# Patient Record
Sex: Female | Born: 1980 | Race: Black or African American | Hispanic: No | Marital: Single | Smoking: Never smoker
Health system: Southern US, Community
[De-identification: ages and names within clinical notes are randomized; demographics above are authoritative.]

## PROBLEM LIST (undated history)

## (undated) ENCOUNTER — Inpatient Hospital Stay (HOSPITAL_COMMUNITY): Payer: Self-pay

## (undated) DIAGNOSIS — R011 Cardiac murmur, unspecified: Secondary | ICD-10-CM

## (undated) DIAGNOSIS — B1910 Unspecified viral hepatitis B without hepatic coma: Secondary | ICD-10-CM

## (undated) DIAGNOSIS — F32A Depression, unspecified: Secondary | ICD-10-CM

## (undated) DIAGNOSIS — F329 Major depressive disorder, single episode, unspecified: Secondary | ICD-10-CM

## (undated) DIAGNOSIS — L409 Psoriasis, unspecified: Secondary | ICD-10-CM

## (undated) HISTORY — PX: WISDOM TOOTH EXTRACTION: SHX21

## (undated) HISTORY — PX: NO PAST SURGERIES: SHX2092

---
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
## 2001-04-21 ENCOUNTER — Ambulatory Visit: Admission: RE | Admit: 2001-04-21 | Discharge: 2001-04-21 | Payer: Self-pay | Admitting: Internal Medicine

## 2001-04-24 ENCOUNTER — Emergency Department (HOSPITAL_COMMUNITY): Admission: EM | Admit: 2001-04-24 | Discharge: 2001-04-24 | Payer: Self-pay | Admitting: Emergency Medicine

## 2001-07-13 ENCOUNTER — Emergency Department (HOSPITAL_COMMUNITY): Admission: EM | Admit: 2001-07-13 | Discharge: 2001-07-14 | Payer: Self-pay | Admitting: Emergency Medicine

## 2001-07-26 ENCOUNTER — Emergency Department (HOSPITAL_COMMUNITY): Admission: EM | Admit: 2001-07-26 | Discharge: 2001-07-26 | Payer: Self-pay | Admitting: Emergency Medicine

## 2001-07-31 ENCOUNTER — Emergency Department (HOSPITAL_COMMUNITY): Admission: EM | Admit: 2001-07-31 | Discharge: 2001-07-31 | Payer: Self-pay

## 2001-11-29 ENCOUNTER — Emergency Department (HOSPITAL_COMMUNITY): Admission: EM | Admit: 2001-11-29 | Discharge: 2001-11-29 | Payer: Self-pay | Admitting: *Deleted

## 2001-12-04 ENCOUNTER — Emergency Department (HOSPITAL_COMMUNITY): Admission: EM | Admit: 2001-12-04 | Discharge: 2001-12-04 | Payer: Self-pay | Admitting: Emergency Medicine

## 2001-12-04 ENCOUNTER — Encounter: Payer: Self-pay | Admitting: *Deleted

## 2002-09-08 ENCOUNTER — Other Ambulatory Visit: Admission: RE | Admit: 2002-09-08 | Discharge: 2002-09-08 | Payer: Self-pay | Admitting: Obstetrics and Gynecology

## 2007-10-20 ENCOUNTER — Other Ambulatory Visit (HOSPITAL_COMMUNITY): Admission: RE | Admit: 2007-10-20 | Discharge: 2007-12-04 | Payer: Self-pay | Admitting: Psychiatry

## 2007-10-22 ENCOUNTER — Emergency Department (HOSPITAL_COMMUNITY): Admission: EM | Admit: 2007-10-22 | Discharge: 2007-10-22 | Payer: Self-pay | Admitting: *Deleted

## 2007-10-23 ENCOUNTER — Ambulatory Visit: Payer: Self-pay | Admitting: Psychiatry

## 2009-06-13 ENCOUNTER — Emergency Department (HOSPITAL_COMMUNITY): Admission: EM | Admit: 2009-06-13 | Discharge: 2009-06-13 | Payer: Self-pay | Admitting: Emergency Medicine

## 2009-09-16 ENCOUNTER — Emergency Department (HOSPITAL_COMMUNITY): Admission: EM | Admit: 2009-09-16 | Discharge: 2009-09-17 | Payer: Self-pay | Admitting: Emergency Medicine

## 2009-09-16 LAB — CONVERTED CEMR LAB
BUN: 4 mg/dL
Basophils Absolute: 0.1 10*3/uL
Basophils Relative: 2 %
CO2: 23 meq/L
Calcium: 8.9 mg/dL
Chloride: 107 meq/L
Creatinine, Ser: 0.61 mg/dL
Eosinophils Absolute: 0.1 10*3/uL
Eosinophils Relative: 4 %
Glucose, Bld: 102 mg/dL
HCT: 43.2 %
Hemoglobin: 14.7 g/dL
Lymphocytes Relative: 35 %
Lymphs Abs: 1.1 10*3/uL
MCHC: 34 g/dL
MCV: 99.8 fL
Monocytes Absolute: 0.4 10*3/uL
Monocytes Relative: 12 %
Neutro Abs: 1.4 10*3/uL
Neutrophils Relative %: 47 %
Platelets: 213 10*3/uL
Potassium: 3.3 meq/L
RBC: 4.33 M/uL
RDW: 13.3 %
Sodium: 142 meq/L
WBC: 3.1 10*3/uL

## 2009-09-17 ENCOUNTER — Inpatient Hospital Stay (HOSPITAL_COMMUNITY): Admission: AD | Admit: 2009-09-17 | Discharge: 2009-09-19 | Payer: Self-pay | Admitting: Psychiatry

## 2009-09-17 ENCOUNTER — Ambulatory Visit: Payer: Self-pay | Admitting: Psychiatry

## 2009-10-11 ENCOUNTER — Encounter (INDEPENDENT_AMBULATORY_CARE_PROVIDER_SITE_OTHER): Payer: Self-pay | Admitting: Nurse Practitioner

## 2009-10-11 ENCOUNTER — Ambulatory Visit: Payer: Self-pay | Admitting: Nurse Practitioner

## 2009-10-11 ENCOUNTER — Telehealth (INDEPENDENT_AMBULATORY_CARE_PROVIDER_SITE_OTHER): Payer: Self-pay | Admitting: Nurse Practitioner

## 2009-10-11 DIAGNOSIS — F1010 Alcohol abuse, uncomplicated: Secondary | ICD-10-CM | POA: Insufficient documentation

## 2009-10-11 DIAGNOSIS — F329 Major depressive disorder, single episode, unspecified: Secondary | ICD-10-CM | POA: Insufficient documentation

## 2009-10-11 DIAGNOSIS — L408 Other psoriasis: Secondary | ICD-10-CM

## 2009-10-11 LAB — CONVERTED CEMR LAB: Rapid HIV Screen: NEGATIVE

## 2009-10-13 ENCOUNTER — Encounter (INDEPENDENT_AMBULATORY_CARE_PROVIDER_SITE_OTHER): Payer: Self-pay | Admitting: Nurse Practitioner

## 2009-10-13 LAB — CONVERTED CEMR LAB
Eosinophils Absolute: 0.3 10*3/uL (ref 0.0–0.7)
Eosinophils Relative: 10 % — ABNORMAL HIGH (ref 0–5)
HCT: 43.2 % (ref 36.0–46.0)
Lymphs Abs: 1 10*3/uL (ref 0.7–4.0)
MCV: 98.9 fL (ref 78.0–100.0)
Platelets: 175 10*3/uL (ref 150–400)
Sed Rate: 3 mm/hr (ref 0–22)
WBC: 2.7 10*3/uL — ABNORMAL LOW (ref 4.0–10.5)

## 2009-11-08 ENCOUNTER — Ambulatory Visit: Payer: Self-pay | Admitting: Nurse Practitioner

## 2009-11-09 ENCOUNTER — Encounter (INDEPENDENT_AMBULATORY_CARE_PROVIDER_SITE_OTHER): Payer: Self-pay | Admitting: Internal Medicine

## 2009-11-09 LAB — CONVERTED CEMR LAB
ALT: 43 units/L — ABNORMAL HIGH (ref 0–35)
AST: 66 units/L — ABNORMAL HIGH (ref 0–37)
BUN: 7 mg/dL (ref 6–23)
Basophils Absolute: 0.1 10*3/uL (ref 0.0–0.1)
Basophils Relative: 2 % — ABNORMAL HIGH (ref 0–1)
Cholesterol: 195 mg/dL (ref 0–200)
Creatinine, Ser: 0.66 mg/dL (ref 0.40–1.20)
Eosinophils Relative: 2 % (ref 0–5)
HCT: 43.2 % (ref 36.0–46.0)
HDL: 80 mg/dL (ref >39)
Hemoglobin: 14.8 g/dL (ref 12.0–15.0)
Hep B C IgM: NEGATIVE
MCHC: 34.3 g/dL (ref 30.0–36.0)
MCV: 96.4 fL (ref 78.0–100.0)
Monocytes Absolute: 1.1 10*3/uL — ABNORMAL HIGH (ref 0.1–1.0)
RDW: 13.9 % (ref 11.5–15.5)
Total Bilirubin: 0.6 mg/dL (ref 0.3–1.2)
Total CHOL/HDL Ratio: 2.4
VLDL: 15 mg/dL (ref 0–40)

## 2009-11-11 ENCOUNTER — Ambulatory Visit: Payer: Self-pay | Admitting: Internal Medicine

## 2009-11-16 ENCOUNTER — Telehealth (INDEPENDENT_AMBULATORY_CARE_PROVIDER_SITE_OTHER): Payer: Self-pay | Admitting: Nurse Practitioner

## 2009-12-12 ENCOUNTER — Encounter (INDEPENDENT_AMBULATORY_CARE_PROVIDER_SITE_OTHER): Payer: Self-pay | Admitting: Nurse Practitioner

## 2010-01-10 ENCOUNTER — Ambulatory Visit: Payer: Self-pay | Admitting: Nurse Practitioner

## 2010-01-12 ENCOUNTER — Encounter (INDEPENDENT_AMBULATORY_CARE_PROVIDER_SITE_OTHER): Payer: Self-pay | Admitting: Nurse Practitioner

## 2010-01-30 ENCOUNTER — Ambulatory Visit: Payer: Self-pay | Admitting: Nurse Practitioner

## 2010-01-30 DIAGNOSIS — R740 Nonspecific elevation of levels of transaminase and lactic acid dehydrogenase [LDH]: Secondary | ICD-10-CM

## 2010-01-30 DIAGNOSIS — B181 Chronic viral hepatitis B without delta-agent: Secondary | ICD-10-CM | POA: Insufficient documentation

## 2010-01-30 DIAGNOSIS — B1910 Unspecified viral hepatitis B without hepatic coma: Secondary | ICD-10-CM

## 2010-01-30 LAB — CONVERTED CEMR LAB
ALT: 35 units/L (ref 0–35)
AST: 53 units/L — ABNORMAL HIGH (ref 0–37)
Albumin: 4.9 g/dL (ref 3.5–5.2)
CO2: 23 meq/L (ref 19–32)
Calcium: 9.5 mg/dL (ref 8.4–10.5)
Chloride: 102 meq/L (ref 96–112)
Creatinine, Ser: 0.63 mg/dL (ref 0.40–1.20)
Hepatitis B Surface Ag: POSITIVE — AB
INR: 1.03 (ref <1.50)
Potassium: 3.9 meq/L (ref 3.5–5.3)
Total Protein: 7.2 g/dL (ref 6.0–8.3)

## 2010-02-03 ENCOUNTER — Telehealth (INDEPENDENT_AMBULATORY_CARE_PROVIDER_SITE_OTHER): Payer: Self-pay | Admitting: Nurse Practitioner

## 2010-03-14 ENCOUNTER — Encounter (INDEPENDENT_AMBULATORY_CARE_PROVIDER_SITE_OTHER): Payer: Self-pay | Admitting: Nurse Practitioner

## 2010-03-23 ENCOUNTER — Telehealth (INDEPENDENT_AMBULATORY_CARE_PROVIDER_SITE_OTHER): Payer: Self-pay | Admitting: Nurse Practitioner

## 2010-04-16 ENCOUNTER — Encounter: Payer: Self-pay | Admitting: Gastroenterology

## 2010-04-23 LAB — CONVERTED CEMR LAB
HCV Ab: NEGATIVE
Hep B S Ab: NEGATIVE

## 2010-04-25 NOTE — Letter (Signed)
Summary: MAILED REQUESTED RECORDS TO [facility]  MAILED REQUESTED RECORDS TO [facility]   Imported By: Arta Bruce 12/12/2009 12:35:02  _____________________________________________________________________  External Attachment:    Type:   Image     Comment:   External Document

## 2010-04-25 NOTE — Assessment & Plan Note (Signed)
Summary: Lab results   Vital Signs:  Patient profile:   30 year old female LMP:     01/04/2010 Weight:      172.5 pounds BMI:     27.11 Temp:     98.2 degrees F oral Pulse rate:   80 / minute Pulse rhythm:   regular Resp:     20 per minute BP sitting:   110 / 80  (left arm) Cuff size:   regular  Vitals Entered By: Levon Hedger (January 30, 2010 11:59 AM)  Nutrition Counseling: Patient's BMI is greater than 25 and therefore counseled on weight management options. CC: psoriasis...lab results, Depression Is Patient Diabetic? No Pain Assessment Patient in pain? yes     Location: body Intensity: 3  Does patient need assistance? Functional Status Self care Ambulation Normal LMP (date): 01/04/2010     Enter LMP: 01/04/2010   CC:  psoriasis...lab results and Depression.  History of Present Illness:  Pt into the office for follow up on labs done in August 2011. pt has psoriasis and is VERY interested in getting Humira injections. She has been seen by dermatology for initial consult.  However, her liver labs were elevated. Pt returns today and reports that she was dx with hepatitis B about 5 years ago at a local GYN office. She notes that she was contacted by the [facility] and was advised that she may "get rid" of the disease and that no further workup was needed. She does continue to use ETOH in excess   Depression History:      Positive alarm features for depression include psychomotor agitation.        Psychosocial stress factors include major life changes.        Comments:  known Dx - this Jackelynn Hosie has tried to set pt up with the [facility] center, even went so far as to have the mental health counselor set up a contract and met pt there however she will not follow through with treatment.   Allergies (verified): No Known Drug Allergies  Review of Systems CV:  Denies chest pain or discomfort. Resp:  Denies cough. GI:  Denies abdominal pain, nausea, and  vomiting. Derm:  Complains of lesion(s) and poor wound healing. Psych:  Complains of depression.  Physical Exam  General:  alert.   Head:  normocephalic.   Msk:  normal ROM.   Neurologic:  alert & oriented X3.   Skin:  full body psoriatic plaques  scaly, eythematous bases Psych:  poor eye contact and moderately anxious.     Impression & Recommendations:  Problem # 1:  HEPATITIS B (ICD-070.30) Pt does have a history and will check to see if she has active status today. will also review case with liver specialist Orders: T-Hepatitis B Surface Antibody [phone]) T-Hepatitis B Core Total [phone]) T-Hepatitis Be Antigen (72536-64403) T-Protime, Auto ([id number]) T-Comprehensive Metabolic Panel (75643-32951)  Problem # 2:  ALCOHOL ABUSE (ICD-305.00) advised pt that pt that she needs to decrease especially in light of elevated LFT's  Problem # 3:  PSORIASIS (ICD-696.1) pt would like to start on humira and she has been to [facility] who states that she needs clearance from this office  Problem # 4:  DEPRESSION (ICD-311) pt has not followed through with treatment as encouraged life stressors are continuing to mount  Problem # 5:  NEED PROPHYLACTIC VACCINATION&INOCULATION FLU (ICD-V04.81) given today  Complete Medication List: 1)  Betamethasone Valerate 0.1 % Oint (Betamethasone valerate) .... Apply to affected area two  times a day  Other Orders: Flu Vaccine 38yrs + (40347) Admin 1st Vaccine [phone])  Patient Instructions: 1)  Schedule an appointment at the dermatology clinic 2)  This Damascus Feldpausch will review your results when available and determine if your infection is active or if it cleared. 3)  You have received the flu vaccine today. This should not make you sick.  It is not a live virus.   Orders Added: 1)  Est. Patient Level III [63875] 2)  T-Hepatitis B Surface Antibody [[id number]] 3)  T-Hepatitis B Core Total [86704-23570] 4)  T-Hepatitis Be Antigen  [41660-63016] 5)  T-Protime, Auto [[id number]] 6)  T-Comprehensive Metabolic Panel [80053-22900] 7)  Flu Vaccine 73yrs + [90658] 8)  Admin 1st Vaccine [32202]   Immunizations Administered:  Influenza Vaccine # 1:    Vaccine Type: Fluvax 3+    Site: left deltoid    Mfr: GlaxoSmithKline    Dose: 0.5 ml    Route: IM    Given by: Gaylyn Cheers RN    Exp. Date: 09/23/2010    Lot #: [id number]    VIS given: 10/18/09 version given January 30, 2010.  Flu Vaccine Consent Questions:    Do you have a history of severe allergic reactions to this vaccine? no    Any prior history of allergic reactions to egg and/or gelatin? no    Do you have a sensitivity to the preservative Thimersol? no    Do you have a past history of Guillan-Barre Syndrome? no    Do you currently have an acute febrile illness? no    Have you ever had a severe reaction to latex? no    Vaccine information given and explained to patient? yes    Are you currently pregnant? no   Immunizations Administered:  Influenza Vaccine # 1:    Vaccine Type: Fluvax 3+    Site: left deltoid    Mfr: GlaxoSmithKline    Dose: 0.5 ml    Route: IM    Given by: Gaylyn Cheers RN    Exp. Date: 09/23/2010    Lot #: [id number]    VIS given: 10/18/09 version given January 30, 2010.  Prevention & Chronic Care Immunizations   Influenza vaccine: Fluvax 3+  (01/30/2010)    Tetanus booster: Not documented    Pneumococcal vaccine: Not documented  Other Screening   Pap smear: Not documented   Smoking status: never  (10/11/2009)   Nursing Instructions: Give Flu vaccine today

## 2010-04-25 NOTE — Letter (Signed)
Summary: PT INFORMATION SHEET  PT INFORMATION SHEET   Imported By: Arta Bruce 10/12/2009 10:41:37  _____________________________________________________________________  External Attachment:    Type:   Image     Comment:   External Document

## 2010-04-25 NOTE — Letter (Signed)
Summary: [facility]ICAL [facility]  [facility]ICAL [facility]TY [facility]   Imported By: Arta Bruce 02/22/2010 14:45:06  _____________________________________________________________________  External Attachment:    Type:   Image     Comment:   External Document

## 2010-04-25 NOTE — Assessment & Plan Note (Signed)
Summary: tb reading/lr  Nurse Visit   Allergies: No Known Drug Allergies  PPD Results    Date of reading: 11/11/2009    Results: < 5mm    Interpretation: negative  Orders Added: 1)  Est. Patient Level I [11914]

## 2010-04-25 NOTE — Letter (Signed)
Summary: [facility]   Imported By: Arta Bruce 02/06/2010 09:50:48  _____________________________________________________________________  External Attachment:    Type:   Image     Comment:   External Document

## 2010-04-25 NOTE — Progress Notes (Signed)
Summary: Rx  Phone Note Outgoing Call   Summary of Call: Rx in basket call pt and [facility] Initial call taken by: Lehman Prom FNP,  October 11, 2009 2:22 PM  Follow-up for Phone Call        [name]  October 11, 2009 2:27 PM Left message on machine for pt to return call to the office.  spoke with pt fax medication to [facility] on [address]. Follow-up by: [name],  October 11, 2009 2:34 PM

## 2010-04-25 NOTE — Progress Notes (Signed)
Summary: Medical Specialty Services  Phone Note Outgoing Call   Summary of Call: refer pt to medical specialty services  Notify pt that she does need referral to medical specialty services before she starts Humira I sent her information to them and they agree that she needs further workup on her liver Initial call taken by: Lehman Prom FNP,  February 03, 2010 8:42 AM  Follow-up for Phone Call        I SEND THE REFERRAL TO MEDICAL SPECIALTY AND I TALK TO MS Riegler BUT SHE WANTS TO TALK TO FNP Kathrynn Humble CALL HER @  [phone] Follow-up by: Cheryll Dessert,  February 03, 2010 4:39 PM  Additional Follow-up for Phone Call Additional follow up Details #1::        called pt and mother - informed them that she will need to be referred to [facility]. Pt does want dermatololgy appt on 03/07/2010 however I advised pt she needs referral [facility] and she understands. proceed with the referral Additional Follow-up by: Lehman Prom FNP,  February 03, 2010 4:44 PM    Additional Follow-up for Phone Call Additional follow up Details #2::    Noted .Marland KitchenCheryll Dessert  February 03, 2010 5:51 PM

## 2010-04-25 NOTE — Assessment & Plan Note (Signed)
Summary: NEW - Establish Care   Vital Signs:  Patient profile:   30 year old female LMP:     09/2009 Height:      67 inches Weight:      187 pounds BMI:     29.39 Temp:     98.2 degrees F oral Pulse rate:   72 / minute Pulse rhythm:   regular Resp:     16 per minute BP sitting:   130 / 80  (left arm) Cuff size:   regular  Vitals Entered By: Levon Hedger (October 11, 2009 9:43 AM) CC: hospital follow-up...needs referral to dermatologist and dentist, Depression Is Patient Diabetic? No Pain Assessment Patient in pain? yes     Location: skin,scalp  Does patient need assistance? Functional Status Self care Ambulation Normal LMP (date): 09/2009     Enter LMP: 09/2009   CC:  hospital follow-up...needs referral to dermatologist and dentist and Depression.  History of Present Illness:  Pt into the office to establish care. Pt was a Consulting civil engineer at [employer] and was being seen there at student health for Psoriasis She was also seeing a couselor for depression. She was recently committed for suicidal ideation at [facility] and then to [facility]. Pt was taken there due to intoxication and uncooperativeness.  Pt was committed by therapist for depression and suicidal ideation with reported plan to take sleeping medicaion. She admits that she was thinking of ways to die. "I really don't have a plan" Pt had stopped taking her medications for depression. However she did admit that she took an overdose of pills prior to being voluntarily committed. Pt was discharged on trazodone which she finished on yesterday (determined that pt took in combination with otc meds and alcohol for sleep) She was also advised to set up counseling for depression and she did call to set up and appointment and then she called and cancelled.  Full ER report reviewed Alcohol level 313 urine drug screen negative  Pt lives with her mother and brother.   Depression History:      The patient is having a  depressed mood most of the day and has a diminished interest in her usual daily activities.  Positive alarm features for depression include fatigue (loss of energy) and impaired concentration (indecisiveness).        Psychosocial stress factors include major life changes.  Risk factors for depression include a personal history of depression.  Suicide risk questions reveal that she does not feel like life is worth living, she has thought about ending her life, and she has even planned how to end her life.  The patient denies that she wishes that she were dead.         Habits & Providers  Alcohol-Tobacco-Diet     Alcohol drinks/day: 2     Alcohol Counseling: to STOP drinking     Alcohol type: liquor     Tobacco Status: never  Exercise-Depression-Behavior     Have you felt down or hopeless? yes     Have you felt little pleasure in things? yes     Depression Counseling: not indicated; screening negative for depression     Drug Use: never  Medications Prior to Update: 1)  None  Allergies (verified): No Known Drug Allergies  Family History: maternal aunt - breast cancer  Social History: No children Tobacco - none ETOH - liquor drug use - noneSmoking Status:  never Drug Use:  never  Review of Systems General:  Complains  of sleep disorder; " I have trouble sleeping - that's why I drink all the time". CV:  Denies chest pain or discomfort. Resp:  Denies cough. GI:  Denies abdominal pain, nausea, and vomiting. Derm:  Complains of itching and rash. Psych:  Complains of depression.  Physical Exam  General:  alert.   Head:  normocephalic.   Lungs:  normal breath sounds.   Heart:  normal rate and regular rhythm.   Msk:  normal ROM.   Extremities:  1+ left pedal edema and 1+ right pedal edema.   Neurologic:  alert & oriented X3.   Skin:  full body psoriatic plaques  scaly, eythematous bases, tender Psych:  Oriented X3.     Impression & Recommendations:  Problem # 1:   DEPRESSION (ICD-311) Pt had appt with Aquilla Solian scheduled after office visit with provider so she went to see the pt in the room She/provider determined that since pt has current suicidal ideations that she should be evaluated at Emergency services pt aware of plan and she will go directly to the [facility]  Her updated medication list for this problem includes:    Trazodone Hcl 50 Mg Tabs (Trazodone hcl) ..... One tablet by mouth nightly as needed for sleep  Problem # 2:  PSORIASIS (ICD-696.1) full body  depomerol given today in office  will start prednisone tapter pt will need dermatology appt Orders: T-Sed Rate (Automated) [phone]) T-Rheumatoid Factor [id number]) T-C-Reactive Protein [id number]) T-Antinuclear Antib (ANA) [phone]) T-CBC w/Diff (02725-36644) T-Syphilis Test (RPR) (03474-25956) Rapid HIV  (38756) Depo- Medrol 80mg  (J1040) Admin of Therapeutic Inj  intramuscular or subcutaneous (43329) Dermatology Referral (Derma)  Problem # 3:  ALCOHOL ABUSE (ICD-305.00) Aquilla Solian spoke with pt about inpt treatment - she declined at this time  Complete Medication List: 1)  Prednisone (pak) 10 Mg Tabs (Prednisone) .... Take as directed 2)  Trazodone Hcl 50 Mg Tabs (Trazodone hcl) .... One tablet by mouth nightly as needed for sleep 3)  Betamethasone Valerate 0.1 % Oint (Betamethasone valerate) .... Apply to affected area two times a day  Patient Instructions: 1)  Follow up in office with n.martin in 1 week. 2)  Psoriasis - Take medication according to taper package - START ON WEDNESDAY 3)  May use ointment to affected area twice a day -Mix with vasoline and apply to body 4)  Depression - Follow instructions as given by Aquilla Solian 5)  Will give 1 week worth of trazodone to take at night for sleep 6)  You most likely will need a medication to help with depressive symptoms but will re-assess in 1 week Prescriptions: BETAMETHASONE VALERATE 0.1 % OINT  (BETAMETHASONE VALERATE) Apply to affected area two times a day  #45gm x 1   Entered and Authorized by:   Lehman Prom FNP   Signed by:   Lehman Prom FNP on 10/11/2009   Method used:   Print then Give to Patient   RxID:   [id number] TRAZODONE HCL 50 MG TABS (TRAZODONE HCL) One tablet by mouth nightly as needed for sleep  #7 x 0   Entered and Authorized by:   Lehman Prom FNP   Signed by:   Lehman Prom FNP on 10/11/2009   Method used:   Print then Give to Patient   RxID:   [id number] PREDNISONE (PAK) 10 MG TABS (PREDNISONE) Take as directed  #qs x 0   Entered and Authorized by:   Lehman Prom FNP   Signed by:   Lehman Prom  FNP on 10/11/2009   Method used:   Print then Give to Patient   RxID:   [id number]   Laboratory Results  Date/Time Received: October 11, 2009 11:19 AM  Date/Time Reported: October 11, 2009 11:19 AM   Other Tests  Rapid HIV: negative     Medication Administration  Injection # 1:    Medication: Depo- Medrol 80mg     Diagnosis: PSORIASIS (ICD-696.1)    Route: IM    Site: RUOQ gluteus    Exp Date: 02/2010    Lot #: [id number]    Mfr: Pharmacia    Patient tolerated injection without complications    Given by: Levon Hedger (October 11, 2009 11:35 AM)  Orders Added: 1)  New Patient age 38-39 [99385] 2)  T-Sed Rate (Automated) [phone] 3)  T-Rheumatoid Factor [id number] 4)  T-C-Reactive Protein [34742-59563] 5)  T-Antinuclear Antib (ANA) [87564-33295] 6)  T-CBC w/Diff [18841-66063] 7)  T-Syphilis Test (RPR) [01601-09323] 8)  Rapid HIV  [92370] 9)  Depo- Medrol 80mg  [J1040] 10)  Admin of Therapeutic Inj  intramuscular or subcutaneous [96372] 11)  Dermatology Referral [Derma]

## 2010-04-25 NOTE — Progress Notes (Signed)
Summary: Dermatology appt  Phone Note Outgoing Call   Summary of Call: schedule pt an appt with dermatology clinic at [facility] Initial call taken by: Lehman Prom FNP,  October 11, 2009 2:29 PM  Follow-up for Phone Call        I LVM YESTERDAY & TODAY TO PT . PLS CALL BACK TO SCHEDULE  A DERMATOLOGY APPT . Follow-up by: Cheryll Dessert,  October 14, 2009 9:46 AM  Additional Follow-up for Phone Call Additional follow up Details #1::        noted Additional Follow-up by: Lehman Prom FNP,  October 14, 2009 1:57 PM    Additional Follow-up for Phone Call Additional follow up Details #2::    pt have an appt 12-13-09 @ 5pm ..Cheryll Dessert .

## 2010-04-25 NOTE — Progress Notes (Signed)
Summary: [facility] (DERMATOLOGY CLINIC)   Phone Note Call from Patient   Caller: Patient Reason for Call: Talk to Nurse Summary of Call: PT WENT TO [facility] FOR DERMATOLOGY APPT AND THE DR DID A LAB WORK AND THEY TOLD PT THAT AFTER THEY GET THE RESULTS THEY WILL GIVE HER A PRESCRIPTION . PLEASE, CALL HER [phone]  THANK YOU .  Initial call taken by: Cheryll Dessert,  November 16, 2009 2:38 PM  Follow-up for Phone Call        Levon Hedger  November 16, 2009 4:16 PM Left message on machine for pt to return call to the office.  Additional Follow-up for Phone Call Additional follow up Details #1::        i'm not sure what prescription that she was going to be given however i do have the results.  She needs to f/u with med for lab results. Regarding derm meds - I need the dermatology office visit - call [facility] [address] and have them send it over Additional Follow-up by: Lehman Prom FNP,  November 16, 2009 6:22 PM    Additional Follow-up for Phone Call Additional follow up Details #2::     Left message on answering machine for pt. to return call.  Medical Records says they currently do not have the Derm records for this pt. Dutch Quint RN  November 18, 2009 9:57 AM  Derm. notes are on your desk.  Dutch Quint RN  November 29, 2009 11:39 AM  1.  Looks like labs were done with the intention of pt starting Humira. That is an injection of medication and we don't have that medication available here.  It is very costly. Contact Juliette Alcide - I can't make out the name of the provider that saw the pt but perhaps Juliette Alcide can answer if it Was the intent that the pt be seen in the Dermatologist office for initiation of Humira.  2.  Pt does need to f/u in this office as she did have some abnormal labs that need f/u - schedule her a f/u appt with me n.martin,fnp November 29, 2009  1:55 PM  Left message on answering machine for pt. to return call.  Dutch Quint RN  December 05, 2009 10:39 AM   Additional Follow-up for  Phone Call Additional follow up Details #3:: Details for Additional Follow-up Action Taken: As per [facility] -- pt. has a f/u appt. so that the dermatologist Dr. Jorja Loa will review her lab results and prescribe her the Humira if indicated.  Appt. 01/13/10 with Jesse Fall.  Dutch Quint RN  December 07, 2009 11:10 AM  noted n.martin,fnp December 07, 2009 1:13 PM

## 2010-04-25 NOTE — Letter (Signed)
Summary: [facility]   Imported By: Arta Bruce 11/29/2009 15:30:51  _____________________________________________________________________  External Attachment:    Type:   Image     Comment:   External Document

## 2010-04-27 NOTE — Progress Notes (Signed)
Summary: Meds from Dermatology Clinic  Phone Note Call from Patient   Summary of Call: phone [phone] pt whent to dermatology office 2 weeks ago and they said [facility] and she still did'nt get her medicine she [facility] and they don't have any order. Initial call taken by: Domenic Polite,  March 23, 2010 4:44 PM  Follow-up for Phone Call        Will call [facility] to check for any meds from Dermatology Clinic.  Dutch Quint RN  March 23, 2010 5:27 PM  Spoke with Juliette Alcide at [facility] who will follow up with Dermatology Clinic notes and call pt.  Dutch Quint RN  March 24, 2010 12:36 PM   Additional Follow-up for Phone Call Additional follow up Details #1::        Burna Mortimer, who works with [facility], has the pt's order on file. She said the pt. will need to bring in her 2010 taxes. She will complete a form have the pt. and PCP sign and should be able to have medications within app. 2 weeks. Burna Mortimer will be her contact person. Pt. is aware of the process. Will bring her taxes here on Tues.  Additional Follow-up by: Gaylyn Cheers RN,  March 24, 2010 3:15 PM

## 2010-04-27 NOTE — Letter (Signed)
Summary: [facility]  DERM CLINIC   Imported By: Arta Bruce 03/24/2010 14:06:16  _____________________________________________________________________  External Attachment:    Type:   Image     Comment:   External Document

## 2010-06-11 LAB — HEPATIC FUNCTION PANEL
Albumin: 3.9 g/dL (ref 3.5–5.2)
Alkaline Phosphatase: 56 U/L (ref 39–117)
Bilirubin, Direct: 0.2 mg/dL (ref 0.0–0.3)
Total Bilirubin: 1.3 mg/dL — ABNORMAL HIGH (ref 0.3–1.2)

## 2010-06-11 LAB — DIFFERENTIAL
Basophils Relative: 2 % — ABNORMAL HIGH (ref 0–1)
Eosinophils Absolute: 0.1 10*3/uL (ref 0.0–0.7)
Eosinophils Relative: 4 % (ref 0–5)
Lymphs Abs: 1.1 10*3/uL (ref 0.7–4.0)
Monocytes Absolute: 0.4 10*3/uL (ref 0.1–1.0)
Monocytes Relative: 12 % (ref 3–12)
Neutrophils Relative %: 47 % (ref 43–77)

## 2010-06-11 LAB — BASIC METABOLIC PANEL
CO2: 23 mEq/L (ref 19–32)
Chloride: 107 mEq/L (ref 96–112)
GFR calc Af Amer: >60 mL/min (ref >60)
Glucose, Bld: 102 mg/dL — ABNORMAL HIGH (ref 70–99)
Potassium: 3.3 mEq/L — ABNORMAL LOW (ref 3.5–5.1)
Sodium: 142 mEq/L (ref 135–145)

## 2010-06-11 LAB — RAPID URINE DRUG SCREEN, HOSP PERFORMED
Amphetamines: NOT DETECTED
Barbiturates: NOT DETECTED
Benzodiazepines: NOT DETECTED
Tetrahydrocannabinol: NOT DETECTED

## 2010-06-11 LAB — CBC
HCT: 43.2 % (ref 36.0–46.0)
Hemoglobin: 14.7 g/dL (ref 12.0–15.0)
MCH: 34 pg (ref 26.0–34.0)
MCHC: 34 g/dL (ref 30.0–36.0)
MCV: 99.8 fL (ref 78.0–100.0)
RBC: 4.33 MIL/uL (ref 3.87–5.11)

## 2010-06-11 LAB — TRICYCLICS SCREEN, URINE: TCA Scrn: NOT DETECTED

## 2010-06-11 LAB — ACETAMINOPHEN LEVEL: Acetaminophen (Tylenol), Serum: <10 ug/mL — ABNORMAL LOW (ref 10–30)

## 2010-06-14 ENCOUNTER — Telehealth (INDEPENDENT_AMBULATORY_CARE_PROVIDER_SITE_OTHER): Payer: Self-pay | Admitting: Nurse Practitioner

## 2010-06-22 NOTE — Progress Notes (Signed)
Summary: new RX, now medicaid  Phone Note Call from Patient Call back at [phone]   Summary of Call: Now has medicaid needs new prescriptions [facility] in [place], no longer using [employer]. New phone number noted, [phone] Initial call taken by: Ernestine Mcmurray,  June 14, 2010 10:56 AM  Follow-up for Phone Call        Left message on voicemail for pt. to return call.  Dutch Quint RN  June 14, 2010 3:22 PM  Spoke with pt. -- states she's on Humira, not on our med list in EMR.  Spoke with Burna Mortimer -- states Rx was transferred to the [facility] in [place] [phone].  Spoke with pharmacy in [place], states they never received it.  [facility] [place] [facility] to f[facility]u.  Dutch Quint RN  June 14, 2010 3:31 PM    Additional Follow-up for Phone Call Additional follow up Details #1::        Spoke with [facility], Pt is aware she can pick up her Humira it has been filled. Gaylyn Cheers RN  June 15, 2010 4:23 PM

## 2010-12-22 LAB — URINALYSIS, ROUTINE W REFLEX MICROSCOPIC
Glucose, UA: NEGATIVE
Ketones, ur: NEGATIVE
Leukocytes, UA: NEGATIVE
Protein, ur: NEGATIVE
Urobilinogen, UA: 0.2

## 2010-12-22 LAB — URINE DRUGS OF ABUSE SCREEN W ALC, ROUTINE (REF LAB)
Barbiturate Quant, Ur: NEGATIVE
Barbiturate Quant, Ur: NEGATIVE
Barbiturate Quant, Ur: NEGATIVE
Benzodiazepines.: NEGATIVE
Benzodiazepines.: NEGATIVE
Benzodiazepines.: NEGATIVE
Marijuana Metabolite: NEGATIVE
Methadone: NEGATIVE
Methadone: NEGATIVE
Phencyclidine (PCP): NEGATIVE
Phencyclidine (PCP): NEGATIVE
Propoxyphene: NEGATIVE
Propoxyphene: NEGATIVE
Propoxyphene: NEGATIVE

## 2010-12-22 LAB — OPIATE, QUANTITATIVE, URINE
Codeine Urine: NEGATIVE ng/mL
Hydromorphone GC/MS Conf: 200 ng/mL
Morphine, Confirm: NEGATIVE ng/mL

## 2010-12-22 LAB — POCT PREGNANCY, URINE: Preg Test, Ur: NEGATIVE

## 2010-12-22 LAB — URINE MICROSCOPIC-ADD ON

## 2011-06-21 DIAGNOSIS — R300 Dysuria: Secondary | ICD-10-CM | POA: Diagnosis not present

## 2011-06-21 DIAGNOSIS — R52 Pain, unspecified: Secondary | ICD-10-CM | POA: Diagnosis not present

## 2011-06-25 ENCOUNTER — Other Ambulatory Visit (HOSPITAL_COMMUNITY): Payer: Self-pay | Admitting: Internal Medicine

## 2011-06-25 DIAGNOSIS — R300 Dysuria: Secondary | ICD-10-CM

## 2011-06-27 ENCOUNTER — Ambulatory Visit (HOSPITAL_COMMUNITY): Admission: RE | Admit: 2011-06-27 | Payer: Self-pay | Source: Ambulatory Visit

## 2011-06-29 ENCOUNTER — Ambulatory Visit (HOSPITAL_COMMUNITY)
Admission: RE | Admit: 2011-06-29 | Discharge: 2011-06-29 | Disposition: A | Discharge status: Home / Self Care | Payer: Medicare Other | Source: Ambulatory Visit | Attending: Internal Medicine | Admitting: Internal Medicine

## 2011-06-29 DIAGNOSIS — R933 Abnormal findings on diagnostic imaging of other parts of digestive tract: Secondary | ICD-10-CM | POA: Insufficient documentation

## 2011-06-29 DIAGNOSIS — R300 Dysuria: Secondary | ICD-10-CM | POA: Diagnosis not present

## 2011-06-29 DIAGNOSIS — R109 Unspecified abdominal pain: Secondary | ICD-10-CM | POA: Insufficient documentation

## 2011-06-29 MED ORDER — IOHEXOL 300 MG/ML  SOLN
100.0000 mL | Freq: Once | INTRAMUSCULAR | Status: AC | PRN
  Administered 2011-06-29: 100 mL via INTRAVENOUS

## 2011-08-10 DIAGNOSIS — F329 Major depressive disorder, single episode, unspecified: Secondary | ICD-10-CM | POA: Diagnosis not present

## 2011-09-11 DIAGNOSIS — L259 Unspecified contact dermatitis, unspecified cause: Secondary | ICD-10-CM | POA: Diagnosis not present

## 2011-11-05 DIAGNOSIS — L408 Other psoriasis: Secondary | ICD-10-CM | POA: Diagnosis not present

## 2011-11-10 DIAGNOSIS — F331 Major depressive disorder, recurrent, moderate: Secondary | ICD-10-CM | POA: Diagnosis not present

## 2011-11-16 DIAGNOSIS — Z0000 Encounter for general adult medical examination without abnormal findings: Secondary | ICD-10-CM | POA: Diagnosis not present

## 2011-11-24 DIAGNOSIS — F331 Major depressive disorder, recurrent, moderate: Secondary | ICD-10-CM | POA: Diagnosis not present

## 2012-08-04 DIAGNOSIS — F988 Other specified behavioral and emotional disorders with onset usually occurring in childhood and adolescence: Secondary | ICD-10-CM | POA: Diagnosis not present

## 2012-08-04 DIAGNOSIS — F331 Major depressive disorder, recurrent, moderate: Secondary | ICD-10-CM | POA: Diagnosis not present

## 2012-08-12 DIAGNOSIS — F988 Other specified behavioral and emotional disorders with onset usually occurring in childhood and adolescence: Secondary | ICD-10-CM | POA: Diagnosis not present

## 2012-08-29 ENCOUNTER — Emergency Department (HOSPITAL_COMMUNITY)
Admission: EM | Admit: 2012-08-29 | Discharge: 2012-08-29 | Disposition: A | Discharge status: Home / Self Care | Payer: Medicare Other | Attending: Emergency Medicine | Admitting: Emergency Medicine

## 2012-08-29 ENCOUNTER — Encounter (HOSPITAL_COMMUNITY): Payer: Self-pay | Admitting: *Deleted

## 2012-08-29 DIAGNOSIS — F329 Major depressive disorder, single episode, unspecified: Secondary | ICD-10-CM | POA: Diagnosis not present

## 2012-08-29 DIAGNOSIS — F3289 Other specified depressive episodes: Secondary | ICD-10-CM | POA: Insufficient documentation

## 2012-08-29 DIAGNOSIS — R112 Nausea with vomiting, unspecified: Secondary | ICD-10-CM | POA: Diagnosis not present

## 2012-08-29 DIAGNOSIS — R252 Cramp and spasm: Secondary | ICD-10-CM | POA: Insufficient documentation

## 2012-08-29 DIAGNOSIS — Z79899 Other long term (current) drug therapy: Secondary | ICD-10-CM | POA: Diagnosis not present

## 2012-08-29 DIAGNOSIS — E860 Dehydration: Secondary | ICD-10-CM | POA: Insufficient documentation

## 2012-08-29 DIAGNOSIS — Z8739 Personal history of other diseases of the musculoskeletal system and connective tissue: Secondary | ICD-10-CM | POA: Insufficient documentation

## 2012-08-29 DIAGNOSIS — T426X5A Adverse effect of other antiepileptic and sedative-hypnotic drugs, initial encounter: Secondary | ICD-10-CM | POA: Insufficient documentation

## 2012-08-29 DIAGNOSIS — R109 Unspecified abdominal pain: Secondary | ICD-10-CM | POA: Diagnosis not present

## 2012-08-29 DIAGNOSIS — R197 Diarrhea, unspecified: Secondary | ICD-10-CM | POA: Diagnosis not present

## 2012-08-29 DIAGNOSIS — R42 Dizziness and giddiness: Secondary | ICD-10-CM | POA: Insufficient documentation

## 2012-08-29 DIAGNOSIS — T50905A Adverse effect of unspecified drugs, medicaments and biological substances, initial encounter: Secondary | ICD-10-CM

## 2012-08-29 HISTORY — DX: Psoriasis, unspecified: L40.9

## 2012-08-29 HISTORY — DX: Depression, unspecified: F32.A

## 2012-08-29 HISTORY — DX: Major depressive disorder, single episode, unspecified: F32.9

## 2012-08-29 LAB — CBC WITH DIFFERENTIAL/PLATELET
Basophils Absolute: 0 10*3/uL (ref 0.0–0.1)
Eosinophils Relative: 0 % (ref 0–5)
HCT: 39.4 % (ref 36.0–46.0)
Lymphocytes Relative: 8 % — ABNORMAL LOW (ref 12–46)
MCHC: 35 g/dL (ref 30.0–36.0)
MCV: 95.6 fL (ref 78.0–100.0)
Monocytes Absolute: 0.9 10*3/uL (ref 0.1–1.0)
RDW: 13 % (ref 11.5–15.5)
WBC: 6.3 10*3/uL (ref 4.0–10.5)

## 2012-08-29 LAB — RAPID URINE DRUG SCREEN, HOSP PERFORMED
Barbiturates: NOT DETECTED
Benzodiazepines: NOT DETECTED
Cocaine: NOT DETECTED
Opiates: NOT DETECTED

## 2012-08-29 LAB — COMPREHENSIVE METABOLIC PANEL
AST: 62 U/L — ABNORMAL HIGH (ref 0–37)
CO2: 19 mEq/L (ref 19–32)
Calcium: 10.2 mg/dL (ref 8.4–10.5)
Creatinine, Ser: 0.75 mg/dL (ref 0.50–1.10)
GFR calc Af Amer: >90 mL/min (ref >90)
GFR calc non Af Amer: >90 mL/min (ref >90)

## 2012-08-29 MED ORDER — DIPHENHYDRAMINE HCL 50 MG/ML IJ SOLN
25.0000 mg | Freq: Once | INTRAMUSCULAR | Status: AC
  Administered 2012-08-29: 25 mg via INTRAVENOUS
  Filled 2012-08-29: qty 1

## 2012-08-29 MED ORDER — SODIUM CHLORIDE 0.9 % IV SOLN
1000.0000 mL | INTRAVENOUS | Status: DC
  Administered 2012-08-29: 1000 mL via INTRAVENOUS

## 2012-08-29 MED ORDER — SODIUM CHLORIDE 0.9 % IV SOLN
1000.0000 mL | Freq: Once | INTRAVENOUS | Status: AC
  Administered 2012-08-29: 1000 mL via INTRAVENOUS

## 2012-08-29 MED ORDER — ONDANSETRON HCL 4 MG/2ML IJ SOLN
4.0000 mg | Freq: Once | INTRAMUSCULAR | Status: AC
  Administered 2012-08-29: 4 mg via INTRAVENOUS
  Filled 2012-08-29: qty 2

## 2012-08-29 MED ORDER — METOCLOPRAMIDE HCL 5 MG/ML IJ SOLN
10.0000 mg | Freq: Once | INTRAMUSCULAR | Status: AC
  Administered 2012-08-29: 10 mg via INTRAVENOUS
  Filled 2012-08-29: qty 2

## 2012-08-29 NOTE — ED Provider Notes (Signed)
History  This chart was scribed for Ward Givens, MD by Jiles Prows, ED Scribe. The patient was seen in room APA15/APA15 and the patient's care was started at 7:53 AM.  CSN: [id number]  Arrival date & time 08/29/12  0731   Chief Complaint  Patient presents with  . Abdominal Pain  . Emesis  . Diarrhea    The history is provided by the patient and medical records. No language interpreter was used.   HPI Comments: [name] is a 32 y.o. female who presents to the Emergency Department complaining of moderate to severe constant nausea, vomiting, and diarrhea after starting new medication Naltrexone yesterday morning.  30-45 minutes after taking medication, pt reports feeling dizzy in shower.  After the dizziness, pt reports exhaustion, nausea, vomiting (>20 times since yesterday), stomach and leg cramps, and diarrhea (4 times).   Pt claims weakness and dizziness upon standing.  Pt denies headache, diaphoresis, fever, chills, cough, SOB and any other pain.  She reports her psychiatrist started her on naltrexone for alcohol abuse.   Pt claims she is on disability for psoriatic arthritis and depression. She denies smoking.  Pt claims she last drank 2 days ago.  Prior to that, pt reports that she was drinking 2 beers/day not every day, and a pint of alcohol every few days.  She claims she has smoked marijuana in the past month.  She denies any narcotic use recently.  Pt reports she was in a behavioral psychiatric facility about 4 years ago for depression, but she states she is fine now.  Psychiatrist is Dr. Jannifer Franklin. PCP is Dr. Lannie Fields.  Past Medical History  Diagnosis Date  . Psoriasis   . Depression    History reviewed. No pertinent past surgical history.  No family history on file.  History  Substance Use Topics  . Smoking status: Never Smoker   . Smokeless tobacco: Not on file  . Alcohol Use: Yes     Comment: Occ  on disability for depression and psoriatic arthritis  OB  History   Grav Para Term Preterm Abortions TAB SAB Ect Mult Living                  Review of Systems  Constitutional: Negative for fever and chills.  HENT: Negative for congestion and sore throat.   Respiratory: Negative for cough and shortness of breath.   Cardiovascular: Negative for chest pain and leg swelling.  Gastrointestinal: Positive for nausea, vomiting and diarrhea.  Musculoskeletal: Negative for back pain.  Skin: Negative for rash and wound.  Neurological: Positive for dizziness and weakness.  All other systems reviewed and are negative.    Allergies  Review of patient's allergies indicates no known allergies.  Home Medications   Current Outpatient Rx  Name  Route  Sig  Dispense  Refill  . FLUoxetine (PROZAC) 10 MG tablet   Oral   Take 10 mg by mouth daily.         Marland Kitchen gabapentin (NEURONTIN) 400 MG capsule   Oral   Take 400 mg by mouth at bedtime.         . hydrOXYzine (ATARAX/VISTARIL) 10 MG tablet   Oral   Take 10-30 mg by mouth every 4 (four) hours as needed for itching.         Marland Kitchen ibuprofen (ADVIL,MOTRIN) 800 MG tablet   Oral   Take 800 mg by mouth every 6 (six) hours as needed for pain.         Marland Kitchen  naltrexone (DEPADE) 50 MG tablet   Oral   Take 25-50 mg by mouth daily.           Triage Vitals: BP 136/85  Pulse 96  Temp(Src) 97.7 F (36.5 C) (Oral)  Resp 16  Ht 5\' 7"  (1.702 m)  Wt 180 lb (81.647 kg)  BMI 28.19 kg/m2  SpO2 100%  LMP 08/09/2012  Vital signs normal    Physical Exam  Nursing note and vitals reviewed. Constitutional: She is oriented to person, place, and time. She appears well-developed and well-nourished.  Non-toxic appearance. She does not appear ill. No distress.  HENT:  Head: Normocephalic and atraumatic.  Right Ear: External ear normal.  Left Ear: External ear normal.  Nose: Nose normal. No mucosal edema or rhinorrhea.  Mouth/Throat: Oropharynx is clear and moist and mucous membranes are normal. No dental  abscesses or edematous.  Tongue was dry.  Pt was having dry heaves during exam.  Eyes: Conjunctivae and EOM are normal. Pupils are equal, round, and reactive to light.  Neck: Normal range of motion and full passive range of motion without pain. Neck supple.  Cardiovascular: Normal rate, regular rhythm and normal heart sounds.  Exam reveals no gallop and no friction rub.   No murmur heard. Pulmonary/Chest: Effort normal and breath sounds normal. No respiratory distress. She has no wheezes. She has no rhonchi. She has no rales. She exhibits no tenderness and no crepitus.  Abdominal: Soft. Normal appearance and bowel sounds are normal. She exhibits no distension. There is no tenderness. There is no rebound and no guarding.  Musculoskeletal: Normal range of motion. She exhibits no edema and no tenderness.  Moves all extremities well. Nontender.   Neurological: She is alert and oriented to person, place, and time. She has normal strength. No cranial nerve deficit.  Skin: Skin is warm, dry and intact. No rash noted. No erythema. No pallor.  Psychiatric: She has a normal mood and affect. Her speech is normal and behavior is normal. Her mood appears not anxious.    ED Course  Procedures (including critical care time)  Medications  0.9 %  sodium chloride infusion (0 mLs Intravenous Stopped 08/29/12 1101)    Followed by  0.9 %  sodium chloride infusion (0 mLs Intravenous Stopped 08/29/12 1101)    Followed by  0.9 %  sodium chloride infusion (0 mLs Intravenous Stopped 08/29/12 1316)  metoCLOPramide (REGLAN) injection 10 mg (10 mg Intravenous Given 08/29/12 0855)  diphenhydrAMINE (BENADRYL) injection 25 mg (25 mg Intravenous Given 08/29/12 0855)  ondansetron (ZOFRAN) injection 4 mg (4 mg Intravenous Given 08/29/12 1023)    DIAGNOSTIC STUDIES: Oxygen Saturation is 100% on RA, normal by my interpretation.    COORDINATION OF CARE: 8:04 AM - Discussed ED treatment with pt at bedside including IV and nausea  control and pt agrees.    10:17 AM - Recheck pt.  Pt reports she is still feeling nauseous.  Discussed normal blood work.  Discussed plan to administer fluids and control vomiting.   1:03 PM - Discussed follow up with psychiatrist.  Pt is feeling better. She is sitting on the side of the stretcher and has drank 2 large cups of fluids, feels ready to go home.    Results for orders placed during the hospital encounter of 08/29/12  CBC WITH DIFFERENTIAL      Result Value Range   WBC 6.3  4.0 - 10.5 K/uL   RBC 4.12  3.87 - 5.11 MIL/uL   Hemoglobin 13.8  12.0 - 15.0 g/dL   HCT 16.1  09.6 - 04.5 %   MCV 95.6  78.0 - 100.0 fL   MCH 33.5  26.0 - 34.0 pg   MCHC 35.0  30.0 - 36.0 g/dL   RDW 40.9  81.1 - 91.4 %   Platelets 272  150 - 400 K/uL   Neutrophils Relative % 77  43 - 77 %   Neutro Abs 4.9  1.7 - 7.7 K/uL   Lymphocytes Relative 8 (*) 12 - 46 %   Lymphs Abs 0.5 (*) 0.7 - 4.0 K/uL   Monocytes Relative 14 (*) 3 - 12 %   Monocytes Absolute 0.9  0.1 - 1.0 K/uL   Eosinophils Relative 0  0 - 5 %   Eosinophils Absolute 0.0  0.0 - 0.7 K/uL   Basophils Relative 1  0 - 1 %   Basophils Absolute 0.0  0.0 - 0.1 K/uL  COMPREHENSIVE METABOLIC PANEL      Result Value Range   Sodium 137  135 - 145 mEq/L   Potassium 3.5  3.5 - 5.1 mEq/L   Chloride 96  96 - 112 mEq/L   CO2 19  19 - 32 mEq/L   Glucose, Bld 130 (*) 70 - 99 mg/dL   BUN 10  6 - 23 mg/dL   Creatinine, Ser 7.82  0.50 - 1.10 mg/dL   Calcium 95.6  8.4 - 21.3 mg/dL   Total Protein 9.0 (*) 6.0 - 8.3 g/dL   Albumin 4.7  3.5 - 5.2 g/dL   AST 62 (*) 0 - 37 U/L   ALT 45 (*) 0 - 35 U/L   Alkaline Phosphatase 56  39 - 117 U/L   Total Bilirubin 0.7  0.3 - 1.2 mg/dL   GFR calc non Af Amer >90  >90 mL/min   GFR calc Af Amer >90  >90 mL/min  URINE RAPID DRUG SCREEN (HOSP PERFORMED)      Result Value Range   Opiates NONE DETECTED  NONE DETECTED   Cocaine NONE DETECTED  NONE DETECTED   Benzodiazepines NONE DETECTED  NONE DETECTED    Amphetamines NONE DETECTED  NONE DETECTED   Tetrahydrocannabinol POSITIVE (*) NONE DETECTED   Barbiturates NONE DETECTED  NONE DETECTED   Laboratory interpretation all normal     Results for orders placed in visit on 01/30/10  CONVERTED [facility] LAB      Result Value Range   Prothrombin Time 13.7  11.6-15.2 s   INR 1.03  <1.50   Sodium 138  135-145 meq/L   Potassium 3.9  3.5-5.3 meq/L   Chloride 102  96-112 meq/L   CO2 23  19-32 meq/L   Glucose, Bld 98  70-99 mg/dL   BUN 6  0-86 mg/dL   Creatinine, Ser 5.78  0.40-1.20 mg/dL   Total Bilirubin 0.9  0.3-1.2 mg/dL   Alkaline Phosphatase 49  39-117 units/L   AST 53 (*) 0-37 units/L   ALT 35  0-35 units/L   Total Protein 7.2  6.0-8.3 g/dL   Albumin 4.9  4.6-9.6 g/dL   Calcium 9.5  2.9-52.8 mg/dL   Hepatitis B Surface Ag POS (*) NEGATIVE   Hep B Core Total Ab POS (*) NEGATIVE   Hep B S Ab NEG  NEGATIVE   .  1. Nausea vomiting and diarrhea   2. Dehydration   3. Medication side effect, initial encounter     Plan discharge   Devoria Albe, MD, FACEP   MDM  patient started  naltrexone for alcohol abuse. I suspect she still had alcohol in her system when she took the medication causing her vomiting and diarrhea. Patient is feeling better and ready for discharge.     I personally performed the services described in this documentation, which was scribed in my presence. The recorded information has been reviewed and considered.  Devoria Albe, MD, Armando Gang    Ward Givens, MD 08/29/12 [phone]

## 2012-08-29 NOTE — ED Notes (Addendum)
Vomiting and diarrhea began yesterday evening. Unable to keep anything down. Abdominal cramping also. Pt states she became sick after taking naltrxone to help get her off of alcohol. States she did not read the instructions carefully and she still had alcohol in her system.

## 2013-01-05 ENCOUNTER — Encounter (HOSPITAL_COMMUNITY): Payer: Self-pay | Admitting: Emergency Medicine

## 2013-01-05 ENCOUNTER — Emergency Department (HOSPITAL_COMMUNITY)
Admission: EM | Admit: 2013-01-05 | Discharge: 2013-01-05 | Disposition: A | Discharge status: Home / Self Care | Payer: Medicare Other | Attending: Emergency Medicine | Admitting: Emergency Medicine

## 2013-01-05 DIAGNOSIS — Z872 Personal history of diseases of the skin and subcutaneous tissue: Secondary | ICD-10-CM | POA: Insufficient documentation

## 2013-01-05 DIAGNOSIS — Z8659 Personal history of other mental and behavioral disorders: Secondary | ICD-10-CM | POA: Insufficient documentation

## 2013-01-05 DIAGNOSIS — Z8739 Personal history of other diseases of the musculoskeletal system and connective tissue: Secondary | ICD-10-CM | POA: Diagnosis not present

## 2013-01-05 DIAGNOSIS — R52 Pain, unspecified: Secondary | ICD-10-CM | POA: Diagnosis not present

## 2013-01-05 DIAGNOSIS — H9209 Otalgia, unspecified ear: Secondary | ICD-10-CM | POA: Diagnosis not present

## 2013-01-05 DIAGNOSIS — Z3202 Encounter for pregnancy test, result negative: Secondary | ICD-10-CM | POA: Insufficient documentation

## 2013-01-05 DIAGNOSIS — N390 Urinary tract infection, site not specified: Secondary | ICD-10-CM | POA: Insufficient documentation

## 2013-01-05 DIAGNOSIS — R112 Nausea with vomiting, unspecified: Secondary | ICD-10-CM | POA: Diagnosis not present

## 2013-01-05 DIAGNOSIS — Z79899 Other long term (current) drug therapy: Secondary | ICD-10-CM | POA: Insufficient documentation

## 2013-01-05 DIAGNOSIS — R05 Cough: Secondary | ICD-10-CM | POA: Insufficient documentation

## 2013-01-05 DIAGNOSIS — R059 Cough, unspecified: Secondary | ICD-10-CM | POA: Diagnosis not present

## 2013-01-05 LAB — URINALYSIS, ROUTINE W REFLEX MICROSCOPIC
Glucose, UA: 250 mg/dL — AB
Specific Gravity, Urine: 1.01 (ref 1.005–1.030)
pH: 6.5 (ref 5.0–8.0)

## 2013-01-05 LAB — GLUCOSE, CAPILLARY: Glucose-Capillary: 107 mg/dL — ABNORMAL HIGH (ref 70–99)

## 2013-01-05 LAB — URINE MICROSCOPIC-ADD ON

## 2013-01-05 MED ORDER — CEFTRIAXONE SODIUM 1 G IJ SOLR
1.0000 g | Freq: Once | INTRAMUSCULAR | Status: AC
  Administered 2013-01-05: 1 g via INTRAMUSCULAR
  Filled 2013-01-05: qty 10

## 2013-01-05 MED ORDER — LIDOCAINE HCL (PF) 1 % IJ SOLN
INTRAMUSCULAR | Status: AC
  Administered 2013-01-05: 21:00:00
  Filled 2013-01-05: qty 5

## 2013-01-05 MED ORDER — CEPHALEXIN 500 MG PO CAPS: 500.0000 mg | ORAL_CAPSULE | Freq: Four times a day (QID) | ORAL | Status: DC

## 2013-01-05 MED ORDER — IBUPROFEN 800 MG PO TABS
800.0000 mg | ORAL_TABLET | Freq: Once | ORAL | Status: AC
  Administered 2013-01-05: 800 mg via ORAL
  Filled 2013-01-05: qty 1

## 2013-01-05 MED ORDER — OXYCODONE-ACETAMINOPHEN 5-325 MG PO TABS
1.0000 | ORAL_TABLET | Freq: Once | ORAL | Status: AC
  Administered 2013-01-05: 1 via ORAL
  Filled 2013-01-05: qty 1

## 2013-01-05 NOTE — ED Provider Notes (Signed)
CSN: [id number]     Arrival date & time 01/05/13  1643 History  This chart was scribed for Joya Gaskins, MD by Carl Best, ED Scribe. This patient was seen in room APA05/APA05 and the patient's care was started at 8:37 PM.      Chief Complaint  Patient presents with  . Headache  . Urinary Tract Infection  . Generalized Body Aches    Patient is a 32 y.o. female presenting with headaches and urinary tract infection. The history is provided by the patient. No language interpreter was used.  Headache Pain location:  Generalized Quality:  Sharp Radiates to:  Does not radiate Duration:  4 days Timing:  Constant Chronicity:  New Similar to prior headaches: no   Relieved by:  Nothing Worsened by:  Nothing tried Ineffective treatments:  None tried Associated symptoms: cough, ear pain, fever (103), nausea and vomiting   Associated symptoms: no diarrhea and no sore throat   Cough:    Cough characteristics:  Non-productive   Severity:  Mild   Timing:  Intermittent   Progression:  Unchanged   Chronicity:  New Ear pain:    Severity:  Mild   Duration:  4 days   Timing:  Constant   Progression:  Unchanged   Chronicity:  New Fever:    Duration:  4 days   Timing:  Constant   Max temp PTA (F):  103   Temp source:  Oral   Progression:  Unchanged Nausea:    Severity:  Mild   Duration:  4 days   Timing:  Intermittent Vomiting:    Number of occurrences:  1   Severity:  Mild Urinary Tract Infection Associated symptoms include headaches and shortness of breath (intermittent ).   HPI Comments: [name] is a 32 y.o. female with a history of psoriasis and arthritis who presents to the Emergency Department complaining of headache, body aches, shooting pains through arms and legs, intermittent sharp right flank pain , intermittent ear ache, intermittent nausea, and dysuria that started four days ago.  The patient lists emesis, fever, and intermittent shortness of breath as  associated symptoms.  The patient denies rash, diarrhea, vaginal bleeding, and vaginal discharge as associated symptoms.  The patient denies traveling anywhere new or being prone to infection.    Past Medical History  Diagnosis Date  . Psoriasis   . Depression    History reviewed. No pertinent past surgical history. No family history on file. History  Substance Use Topics  . Smoking status: Never Smoker   . Smokeless tobacco: Not on file  . Alcohol Use: Yes     Comment: Occ   OB History   Grav Para Term Preterm Abortions TAB SAB Ect Mult Living                 Review of Systems  Constitutional: Positive for fever (103).  HENT: Positive for ear pain. Negative for sore throat.   Respiratory: Positive for cough and shortness of breath (intermittent ).   Gastrointestinal: Positive for nausea and vomiting. Negative for diarrhea.  Genitourinary: Positive for dysuria and flank pain (right ). Negative for vaginal bleeding and vaginal discharge.  Skin: Negative for rash.  Neurological: Positive for headaches.  All other systems reviewed and are negative.    Allergies  Review of patient's allergies indicates no known allergies.  Home Medications   Current Outpatient Rx  Name  Route  Sig  Dispense  Refill  . adalimumab (HUMIRA) 40  MG/0.8ML injection   Subcutaneous   Inject 40 mg into the skin every 14 (fourteen) days.         Marland Kitchen DM-Phenylephrine-Acetaminophen (TYLENOL COLD MULTI-SYMPTOM) 10-5-325 MG/15ML LIQD   Oral   Take 5-10 mLs by mouth daily as needed (for cold symptoms).          Triage Vitals: BP 118/86  Pulse 103  Temp(Src) 103 F (39.4 C) (Oral)  Resp 18  Ht 5\' 7"  (1.702 m)  Wt 180 lb (81.647 kg)  BMI 28.19 kg/m2  SpO2 95%  LMP 12/16/2012  Physical Exam CONSTITUTIONAL: Well developed/well nourished HEAD: Normocephalic/atraumatic EYES: EOMI/PERRL ENMT: Mucous membranes moist NECK: supple no meningeal signs SPINE:entire spine nontender CV: S1/S2  noted, no murmurs/rubs/gallops noted LUNGS: Lungs are clear to auscultation bilaterally, no apparent distress ABDOMEN: soft, nontender, no rebound or guarding GU:no cva tenderness NEURO: Pt is awake/alert, moves all extremitiesx4 EXTREMITIES: pulses normal, full ROM SKIN: warm, color normal, evidence of psoriasis noted PSYCH: no abnormalities of mood noted  ED Course  Procedures   DIAGNOSTIC STUDIES: Oxygen Saturation is 95% on room air, adequate by my interpretation.    COORDINATION OF CARE: 8:40 PM- Discussed lab results that revealed a UTI.  Discussed starting the patient on antibiotics and starting the patient on an IV in the ED.  The patient did not want the IV but stated that she would like to receive a shot of antibiotics in the ED and to be discharged home.  Discussed discharging the patient with a prescription for antibiotics and pain medicationThe patient agreed to the treatment plan.   I advised review of labs would be useful to ensure she is not immunosuppressed bu she refused Currently she reports myalgias fever and most of her pain had resolved   Labs Review Labs Reviewed  URINALYSIS, ROUTINE W REFLEX MICROSCOPIC - Abnormal; Notable for the following:    Color, Urine ORANGE (*)    APPearance HAZY (*)    Glucose, UA 250 (*)    Hgb urine dipstick MODERATE (*)    Bilirubin Urine MODERATE (*)    Ketones, ur 15 (*)    Protein, ur >300 (*)    Urobilinogen, UA >8.0 (*)    Nitrite POSITIVE (*)    Leukocytes, UA MODERATE (*)    All other components within normal limits  URINE MICROSCOPIC-ADD ON - Abnormal; Notable for the following:    Squamous Epithelial / LPF MANY (*)    Bacteria, UA MANY (*)    All other components within normal limits  URINE CULTURE  PREGNANCY, URINE   Imaging Review No results found.  EKG Interpretation   None       MDM  No diagnosis found. Nursing notes including past medical history and social history reviewed and considered in  documentation Labs/vital reviewed and considered   I personally performed the services described in this documentation, which was scribed in my presence. The recorded information has been reviewed and is accurate.      Joya Gaskins, MD 01/05/13 [phone]

## 2013-01-05 NOTE — ED Notes (Signed)
Pt reports headache, painful urination,  And nausea for 4 days.  Fever off/on.  Generalized body aches.  No vaginal discharge.

## 2013-01-07 LAB — URINE CULTURE

## 2013-01-08 NOTE — ED Notes (Signed)
+   Urine Treated with Cephalexin-sensitive to same-chart appended per protocol MD.

## 2013-03-09 DIAGNOSIS — L408 Other psoriasis: Secondary | ICD-10-CM | POA: Diagnosis not present

## 2013-09-29 DIAGNOSIS — L408 Other psoriasis: Secondary | ICD-10-CM | POA: Diagnosis not present

## 2013-10-07 DIAGNOSIS — L408 Other psoriasis: Secondary | ICD-10-CM | POA: Diagnosis not present

## 2013-10-07 DIAGNOSIS — Z79899 Other long term (current) drug therapy: Secondary | ICD-10-CM | POA: Diagnosis not present

## 2013-10-14 DIAGNOSIS — M2550 Pain in unspecified joint: Secondary | ICD-10-CM | POA: Diagnosis not present

## 2013-10-14 DIAGNOSIS — Z23 Encounter for immunization: Secondary | ICD-10-CM | POA: Diagnosis not present

## 2013-10-14 DIAGNOSIS — E559 Vitamin D deficiency, unspecified: Secondary | ICD-10-CM | POA: Diagnosis not present

## 2013-10-14 DIAGNOSIS — L408 Other psoriasis: Secondary | ICD-10-CM | POA: Diagnosis not present

## 2013-10-14 DIAGNOSIS — R609 Edema, unspecified: Secondary | ICD-10-CM | POA: Diagnosis not present

## 2013-10-21 DIAGNOSIS — F341 Dysthymic disorder: Secondary | ICD-10-CM | POA: Diagnosis not present

## 2013-11-04 DIAGNOSIS — F341 Dysthymic disorder: Secondary | ICD-10-CM | POA: Diagnosis not present

## 2013-11-05 DIAGNOSIS — E559 Vitamin D deficiency, unspecified: Secondary | ICD-10-CM | POA: Diagnosis not present

## 2013-11-05 DIAGNOSIS — F411 Generalized anxiety disorder: Secondary | ICD-10-CM | POA: Diagnosis not present

## 2013-11-05 DIAGNOSIS — L408 Other psoriasis: Secondary | ICD-10-CM | POA: Diagnosis not present

## 2013-11-05 DIAGNOSIS — M069 Rheumatoid arthritis, unspecified: Secondary | ICD-10-CM | POA: Diagnosis not present

## 2013-11-06 DIAGNOSIS — F341 Dysthymic disorder: Secondary | ICD-10-CM | POA: Diagnosis not present

## 2013-11-20 DIAGNOSIS — Z79899 Other long term (current) drug therapy: Secondary | ICD-10-CM | POA: Diagnosis not present

## 2013-11-20 DIAGNOSIS — L408 Other psoriasis: Secondary | ICD-10-CM | POA: Diagnosis not present

## 2013-11-20 DIAGNOSIS — F341 Dysthymic disorder: Secondary | ICD-10-CM | POA: Diagnosis not present

## 2013-12-02 DIAGNOSIS — F341 Dysthymic disorder: Secondary | ICD-10-CM | POA: Diagnosis not present

## 2013-12-18 DIAGNOSIS — F341 Dysthymic disorder: Secondary | ICD-10-CM | POA: Diagnosis not present

## 2013-12-28 DIAGNOSIS — F341 Dysthymic disorder: Secondary | ICD-10-CM | POA: Diagnosis not present

## 2013-12-29 DIAGNOSIS — M25676 Stiffness of unspecified foot, not elsewhere classified: Secondary | ICD-10-CM | POA: Diagnosis not present

## 2013-12-29 DIAGNOSIS — M797 Fibromyalgia: Secondary | ICD-10-CM | POA: Diagnosis not present

## 2013-12-29 DIAGNOSIS — M25649 Stiffness of unspecified hand, not elsewhere classified: Secondary | ICD-10-CM | POA: Diagnosis not present

## 2014-01-01 DIAGNOSIS — F341 Dysthymic disorder: Secondary | ICD-10-CM | POA: Diagnosis not present

## 2014-01-27 DIAGNOSIS — F341 Dysthymic disorder: Secondary | ICD-10-CM | POA: Diagnosis not present

## 2014-02-01 DIAGNOSIS — F341 Dysthymic disorder: Secondary | ICD-10-CM | POA: Diagnosis not present

## 2014-02-17 DIAGNOSIS — F341 Dysthymic disorder: Secondary | ICD-10-CM | POA: Diagnosis not present

## 2014-03-08 DIAGNOSIS — F341 Dysthymic disorder: Secondary | ICD-10-CM | POA: Diagnosis not present

## 2014-03-17 DIAGNOSIS — F341 Dysthymic disorder: Secondary | ICD-10-CM | POA: Diagnosis not present

## 2014-03-23 DIAGNOSIS — Z3201 Encounter for pregnancy test, result positive: Secondary | ICD-10-CM | POA: Diagnosis not present

## 2014-03-23 DIAGNOSIS — N911 Secondary amenorrhea: Secondary | ICD-10-CM | POA: Diagnosis not present

## 2014-03-31 DIAGNOSIS — F341 Dysthymic disorder: Secondary | ICD-10-CM | POA: Diagnosis not present

## 2014-04-12 DIAGNOSIS — R945 Abnormal results of liver function studies: Secondary | ICD-10-CM | POA: Diagnosis not present

## 2014-04-12 DIAGNOSIS — Z3491 Encounter for supervision of normal pregnancy, unspecified, first trimester: Secondary | ICD-10-CM | POA: Diagnosis not present

## 2014-04-12 DIAGNOSIS — Z124 Encounter for screening for malignant neoplasm of cervix: Secondary | ICD-10-CM | POA: Diagnosis not present

## 2014-04-12 DIAGNOSIS — Z1151 Encounter for screening for human papillomavirus (HPV): Secondary | ICD-10-CM | POA: Diagnosis not present

## 2014-04-12 DIAGNOSIS — Z3A10 10 weeks gestation of pregnancy: Secondary | ICD-10-CM | POA: Diagnosis not present

## 2014-04-12 DIAGNOSIS — O200 Threatened abortion: Secondary | ICD-10-CM | POA: Diagnosis not present

## 2014-04-12 DIAGNOSIS — Z36 Encounter for antenatal screening of mother: Secondary | ICD-10-CM | POA: Diagnosis not present

## 2014-04-12 LAB — OB RESULTS CONSOLE RUBELLA ANTIBODY, IGM: RUBELLA: IMMUNE

## 2014-04-12 LAB — OB RESULTS CONSOLE ABO/RH: RH Type: POSITIVE

## 2014-04-12 LAB — OB RESULTS CONSOLE ANTIBODY SCREEN: ANTIBODY SCREEN: NEGATIVE

## 2014-04-12 LAB — OB RESULTS CONSOLE RPR: RPR: NONREACTIVE

## 2014-04-12 LAB — OB RESULTS CONSOLE HIV ANTIBODY (ROUTINE TESTING): HIV: NONREACTIVE

## 2014-04-29 DIAGNOSIS — Z36 Encounter for antenatal screening of mother: Secondary | ICD-10-CM | POA: Diagnosis not present

## 2014-04-29 DIAGNOSIS — Z3A12 12 weeks gestation of pregnancy: Secondary | ICD-10-CM | POA: Diagnosis not present

## 2014-04-29 DIAGNOSIS — F341 Dysthymic disorder: Secondary | ICD-10-CM | POA: Diagnosis not present

## 2014-05-25 DIAGNOSIS — Z36 Encounter for antenatal screening of mother: Secondary | ICD-10-CM | POA: Diagnosis not present

## 2014-05-25 DIAGNOSIS — O09522 Supervision of elderly multigravida, second trimester: Secondary | ICD-10-CM | POA: Diagnosis not present

## 2014-05-25 DIAGNOSIS — O09292 Supervision of pregnancy with other poor reproductive or obstetric history, second trimester: Secondary | ICD-10-CM | POA: Diagnosis not present

## 2014-05-25 DIAGNOSIS — Z3490 Encounter for supervision of normal pregnancy, unspecified, unspecified trimester: Secondary | ICD-10-CM | POA: Diagnosis not present

## 2014-05-25 DIAGNOSIS — Z3A16 16 weeks gestation of pregnancy: Secondary | ICD-10-CM | POA: Diagnosis not present

## 2014-05-25 LAB — OB RESULTS CONSOLE GC/CHLAMYDIA
Chlamydia: NEGATIVE
Gonorrhea: NEGATIVE

## 2014-06-11 DIAGNOSIS — Z36 Encounter for antenatal screening of mother: Secondary | ICD-10-CM | POA: Diagnosis not present

## 2014-06-11 DIAGNOSIS — O09292 Supervision of pregnancy with other poor reproductive or obstetric history, second trimester: Secondary | ICD-10-CM | POA: Diagnosis not present

## 2014-06-11 DIAGNOSIS — Z3A18 18 weeks gestation of pregnancy: Secondary | ICD-10-CM | POA: Diagnosis not present

## 2014-07-16 DIAGNOSIS — F341 Dysthymic disorder: Secondary | ICD-10-CM | POA: Diagnosis not present

## 2014-08-06 DIAGNOSIS — F341 Dysthymic disorder: Secondary | ICD-10-CM | POA: Diagnosis not present

## 2014-08-11 DIAGNOSIS — O09292 Supervision of pregnancy with other poor reproductive or obstetric history, second trimester: Secondary | ICD-10-CM | POA: Diagnosis not present

## 2014-08-11 DIAGNOSIS — Z23 Encounter for immunization: Secondary | ICD-10-CM | POA: Diagnosis not present

## 2014-08-11 DIAGNOSIS — Z36 Encounter for antenatal screening of mother: Secondary | ICD-10-CM | POA: Diagnosis not present

## 2014-08-11 DIAGNOSIS — Z3A27 27 weeks gestation of pregnancy: Secondary | ICD-10-CM | POA: Diagnosis not present

## 2014-08-19 DIAGNOSIS — F341 Dysthymic disorder: Secondary | ICD-10-CM | POA: Diagnosis not present

## 2014-09-02 DIAGNOSIS — F341 Dysthymic disorder: Secondary | ICD-10-CM | POA: Diagnosis not present

## 2014-09-23 DIAGNOSIS — F341 Dysthymic disorder: Secondary | ICD-10-CM | POA: Diagnosis not present

## 2014-10-06 DIAGNOSIS — Z3A35 35 weeks gestation of pregnancy: Secondary | ICD-10-CM | POA: Diagnosis not present

## 2014-10-06 DIAGNOSIS — Z36 Encounter for antenatal screening of mother: Secondary | ICD-10-CM | POA: Diagnosis not present

## 2014-10-06 LAB — OB RESULTS CONSOLE GBS: GBS: NEGATIVE

## 2014-10-08 DIAGNOSIS — F341 Dysthymic disorder: Secondary | ICD-10-CM | POA: Diagnosis not present

## 2014-10-11 DIAGNOSIS — O98413 Viral hepatitis complicating pregnancy, third trimester: Secondary | ICD-10-CM | POA: Diagnosis not present

## 2014-10-11 DIAGNOSIS — Z3A37 37 weeks gestation of pregnancy: Secondary | ICD-10-CM | POA: Diagnosis not present

## 2014-10-11 DIAGNOSIS — Z2251 Carrier of viral hepatitis B: Secondary | ICD-10-CM | POA: Diagnosis not present

## 2014-10-14 DIAGNOSIS — F341 Dysthymic disorder: Secondary | ICD-10-CM | POA: Diagnosis not present

## 2014-10-20 DIAGNOSIS — B181 Chronic viral hepatitis B without delta-agent: Secondary | ICD-10-CM | POA: Diagnosis not present

## 2014-10-22 DIAGNOSIS — F341 Dysthymic disorder: Secondary | ICD-10-CM | POA: Diagnosis not present

## 2014-10-26 DIAGNOSIS — Z3A37 37 weeks gestation of pregnancy: Secondary | ICD-10-CM | POA: Diagnosis not present

## 2014-10-26 DIAGNOSIS — O09293 Supervision of pregnancy with other poor reproductive or obstetric history, third trimester: Secondary | ICD-10-CM | POA: Diagnosis not present

## 2014-10-26 DIAGNOSIS — O3663X0 Maternal care for excessive fetal growth, third trimester, not applicable or unspecified: Secondary | ICD-10-CM | POA: Diagnosis not present

## 2014-11-04 DIAGNOSIS — F341 Dysthymic disorder: Secondary | ICD-10-CM | POA: Diagnosis not present

## 2014-11-05 DIAGNOSIS — Z3A39 39 weeks gestation of pregnancy: Secondary | ICD-10-CM | POA: Diagnosis not present

## 2014-11-05 DIAGNOSIS — O480 Post-term pregnancy: Secondary | ICD-10-CM | POA: Diagnosis not present

## 2014-11-08 ENCOUNTER — Encounter (HOSPITAL_COMMUNITY): Payer: Self-pay | Admitting: *Deleted

## 2014-11-08 ENCOUNTER — Inpatient Hospital Stay (HOSPITAL_COMMUNITY)
Admission: AD | Admit: 2014-11-08 | Discharge: 2014-11-08 | Disposition: A | Discharge status: Home / Self Care | Payer: Medicare Other | Source: Ambulatory Visit | Attending: Obstetrics and Gynecology | Admitting: Obstetrics and Gynecology

## 2014-11-08 DIAGNOSIS — R609 Edema, unspecified: Secondary | ICD-10-CM | POA: Diagnosis not present

## 2014-11-08 DIAGNOSIS — O1200 Gestational edema, unspecified trimester: Secondary | ICD-10-CM | POA: Diagnosis not present

## 2014-11-08 DIAGNOSIS — O26893 Other specified pregnancy related conditions, third trimester: Secondary | ICD-10-CM | POA: Insufficient documentation

## 2014-11-08 DIAGNOSIS — Z3A40 40 weeks gestation of pregnancy: Secondary | ICD-10-CM | POA: Insufficient documentation

## 2014-11-08 LAB — URINE MICROSCOPIC-ADD ON

## 2014-11-08 LAB — URINALYSIS, ROUTINE W REFLEX MICROSCOPIC
Bilirubin Urine: NEGATIVE
Glucose, UA: NEGATIVE mg/dL
KETONES UR: NEGATIVE mg/dL
Nitrite: NEGATIVE
PH: 6.5 (ref 5.0–8.0)
Protein, ur: NEGATIVE mg/dL
Specific Gravity, Urine: 1.01 (ref 1.005–1.030)
Urobilinogen, UA: 0.2 mg/dL (ref 0.0–1.0)

## 2014-11-08 NOTE — MAU Provider Note (Signed)
  History     CSN: [id number]  Arrival date and time: 11/08/14 1643   None     Chief Complaint  Patient presents with  . Leg Swelling   HPI  OB History    Gravida Para Term Preterm AB TAB SAB Ectopic Multiple Living   1               Past Medical History  Diagnosis Date  . Psoriasis   . Depression     Past Surgical History  Procedure Laterality Date  . No past surgeries    . Wisdom tooth extraction      Family History  Problem Relation Age of Onset  . Cancer Maternal Aunt   . Diabetes Maternal Grandfather   . Cancer Paternal Grandfather     Social History  Substance Use Topics  . Smoking status: Never Smoker   . Smokeless tobacco: None  . Alcohol Use: Yes     Comment: Occ    Allergies: No Known Allergies  Prescriptions prior to admission  Medication Sig Dispense Refill Last Dose  . adalimumab (HUMIRA) 40 MG/0.8ML injection Inject 40 mg into the skin every 14 (fourteen) days.   More than a month at Unknown time  . cephALEXin (KEFLEX) 500 MG capsule Take 1 capsule (500 mg total) by mouth 4 (four) times daily. 40 capsule 0 More than a month at Unknown time  . DM-Phenylephrine-Acetaminophen (TYLENOL COLD MULTI-SYMPTOM) 10-5-325 MG/15ML LIQD Take 5-10 mLs by mouth daily as needed (for cold symptoms).   More than a month at Unknown time    Review of Systems  Constitutional: Negative.   HENT: Negative.   Eyes: Negative.   Respiratory: Negative.   Cardiovascular: Negative.   Gastrointestinal: Negative.   Genitourinary: Negative.   Musculoskeletal: Positive for back pain.  Skin: Negative.   Neurological: Negative.   Endo/Heme/Allergies: Negative.   Psychiatric/Behavioral: Negative.    Physical Exam   Blood pressure 118/62, pulse 79, temperature 98.8 F (37.1 C), temperature source Oral, resp. rate 16, height 5\' 6"  (1.676 m), weight 229 lb 12.8 oz (104.237 kg).  Physical Exam  Constitutional: She is oriented to person, place, and time. She appears  well-developed and well-nourished.  HENT:  Head: Normocephalic.  Neck: Normal range of motion. Neck supple.  Cardiovascular: Normal rate, regular rhythm and normal heart sounds.   Respiratory: Effort normal and breath sounds normal. No respiratory distress.  GI: Soft. There is no tenderness.  Genitourinary: No bleeding in the vagina. Vaginal discharge (mucusy) found.  Musculoskeletal: Normal range of motion. She exhibits edema.  Neurological: She is alert and oriented to person, place, and time.  Skin: Skin is warm and dry.  Psychiatric: She has a normal mood and affect. Her behavior is normal. Judgment and thought content normal.    MAU Course  Procedures  MDM Edema in pregnancy  Assessment and Plan  VSS, 3+ pitting edema lower legs and feet DTR's tr no clonus POC discussed with Dr. POC discussed pt to be discharged   Ambrose Mantle DARLENE 11/08/2014, 5:40 PM

## 2014-11-08 NOTE — MAU Note (Signed)
Past couple days, increased swelling in feet and ankles.  Burning  Stabbing pain, worse when standing. Has a "small headache", did not take anything, (behind right ear); denies visual changes or epigastric pain.

## 2014-11-08 NOTE — Discharge Instructions (Signed)

## 2014-11-12 DIAGNOSIS — O480 Post-term pregnancy: Secondary | ICD-10-CM | POA: Diagnosis not present

## 2014-11-12 DIAGNOSIS — Z3A40 40 weeks gestation of pregnancy: Secondary | ICD-10-CM | POA: Diagnosis not present

## 2014-11-15 ENCOUNTER — Other Ambulatory Visit (HOSPITAL_COMMUNITY): Payer: Self-pay | Admitting: Obstetrics and Gynecology

## 2014-11-15 DIAGNOSIS — Z3A41 41 weeks gestation of pregnancy: Secondary | ICD-10-CM | POA: Diagnosis not present

## 2014-11-15 DIAGNOSIS — O480 Post-term pregnancy: Secondary | ICD-10-CM | POA: Diagnosis not present

## 2014-11-16 ENCOUNTER — Inpatient Hospital Stay (HOSPITAL_COMMUNITY): Payer: Medicare Other | Admitting: Anesthesiology

## 2014-11-16 ENCOUNTER — Encounter (HOSPITAL_COMMUNITY): Payer: Self-pay | Admitting: *Deleted

## 2014-11-16 ENCOUNTER — Inpatient Hospital Stay (HOSPITAL_COMMUNITY)
Admission: AD | Admit: 2014-11-16 | Discharge: 2014-11-19 | DRG: 774 | Disposition: A | Discharge status: Home / Self Care | Payer: Medicare Other | Source: Ambulatory Visit | Attending: Obstetrics and Gynecology | Admitting: Obstetrics and Gynecology

## 2014-11-16 DIAGNOSIS — O411230 Chorioamnionitis, third trimester, not applicable or unspecified: Secondary | ICD-10-CM | POA: Diagnosis present

## 2014-11-16 DIAGNOSIS — B181 Chronic viral hepatitis B without delta-agent: Secondary | ICD-10-CM | POA: Diagnosis present

## 2014-11-16 DIAGNOSIS — O9842 Viral hepatitis complicating childbirth: Secondary | ICD-10-CM | POA: Diagnosis present

## 2014-11-16 DIAGNOSIS — O480 Post-term pregnancy: Secondary | ICD-10-CM | POA: Diagnosis present

## 2014-11-16 DIAGNOSIS — Z3A41 41 weeks gestation of pregnancy: Secondary | ICD-10-CM | POA: Diagnosis present

## 2014-11-16 DIAGNOSIS — L400 Psoriasis vulgaris: Secondary | ICD-10-CM | POA: Diagnosis present

## 2014-11-16 DIAGNOSIS — Z3490 Encounter for supervision of normal pregnancy, unspecified, unspecified trimester: Secondary | ICD-10-CM

## 2014-11-16 DIAGNOSIS — O43893 Other placental disorders, third trimester: Secondary | ICD-10-CM | POA: Diagnosis not present

## 2014-11-16 HISTORY — DX: Unspecified viral hepatitis B without hepatic coma: B19.10

## 2014-11-16 LAB — CBC
HEMATOCRIT: 35.6 % — AB (ref 36.0–46.0)
HEMOGLOBIN: 12.2 g/dL (ref 12.0–15.0)
MCH: 33.7 pg (ref 26.0–34.0)
MCHC: 34.3 g/dL (ref 30.0–36.0)
MCV: 98.3 fL (ref 78.0–100.0)
Platelets: 208 10*3/uL (ref 150–400)
RBC: 3.62 MIL/uL — ABNORMAL LOW (ref 3.87–5.11)
RDW: 13.3 % (ref 11.5–15.5)
WBC: 8.1 10*3/uL (ref 4.0–10.5)

## 2014-11-16 LAB — TYPE AND SCREEN
ABO/RH(D): O POS
Antibody Screen: NEGATIVE

## 2014-11-16 LAB — ABO/RH: ABO/RH(D): O POS

## 2014-11-16 MED ORDER — OXYTOCIN BOLUS FROM INFUSION: 500.0000 mL | INTRAVENOUS | Status: DC

## 2014-11-16 MED ORDER — LIDOCAINE HCL (PF) 1 % IJ SOLN
30.0000 mL | INTRAMUSCULAR | Status: AC | PRN
  Administered 2014-11-16: 30 mL via SUBCUTANEOUS
  Filled 2014-11-16: qty 30

## 2014-11-16 MED ORDER — OXYTOCIN 40 UNITS IN LACTATED RINGERS INFUSION - SIMPLE MED: 62.5000 mL/h | INTRAVENOUS | Status: DC

## 2014-11-16 MED ORDER — LACTATED RINGERS IV SOLN
500.0000 mL | INTRAVENOUS | Status: DC | PRN
  Administered 2014-11-16: 500 mL via INTRAVENOUS
  Administered 2014-11-16: 200 mL via INTRAVENOUS

## 2014-11-16 MED ORDER — LIDOCAINE HCL (PF) 1 % IJ SOLN
INTRAMUSCULAR | Status: DC | PRN
  Administered 2014-11-16 (×2): 4 mL

## 2014-11-16 MED ORDER — ONDANSETRON HCL 4 MG/2ML IJ SOLN
4.0000 mg | Freq: Once | INTRAMUSCULAR | Status: AC
  Administered 2014-11-16: 4 mg via INTRAVENOUS
  Filled 2014-11-16: qty 2

## 2014-11-16 MED ORDER — CITRIC ACID-SODIUM CITRATE 334-500 MG/5ML PO SOLN
30.0000 mL | ORAL | Status: DC | PRN
  Administered 2014-11-16 (×3): 30 mL via ORAL
  Filled 2014-11-16 (×4): qty 15

## 2014-11-16 MED ORDER — EPHEDRINE 5 MG/ML INJ
10.0000 mg | INTRAVENOUS | Status: DC | PRN
  Filled 2014-11-16: qty 2

## 2014-11-16 MED ORDER — PHENYLEPHRINE 40 MCG/ML (10ML) SYRINGE FOR IV PUSH (FOR BLOOD PRESSURE SUPPORT)
80.0000 ug | PREFILLED_SYRINGE | INTRAVENOUS | Status: DC | PRN
  Filled 2014-11-16: qty 20
  Filled 2014-11-16: qty 2

## 2014-11-16 MED ORDER — LACTATED RINGERS IV SOLN
INTRAVENOUS | Status: DC
  Administered 2014-11-16 (×2): via INTRAVENOUS

## 2014-11-16 MED ORDER — OXYCODONE-ACETAMINOPHEN 5-325 MG PO TABS: 2.0000 | ORAL_TABLET | ORAL | Status: DC | PRN

## 2014-11-16 MED ORDER — OXYTOCIN 40 UNITS IN LACTATED RINGERS INFUSION - SIMPLE MED
1.0000 m[IU]/min | INTRAVENOUS | Status: AC
  Administered 2014-11-16: 1 m[IU]/min via INTRAVENOUS
  Filled 2014-11-16: qty 1000

## 2014-11-16 MED ORDER — OXYCODONE-ACETAMINOPHEN 5-325 MG PO TABS: 1.0000 | ORAL_TABLET | ORAL | Status: DC | PRN

## 2014-11-16 MED ORDER — DIPHENHYDRAMINE HCL 50 MG/ML IJ SOLN: 12.5000 mg | INTRAMUSCULAR | Status: DC | PRN

## 2014-11-16 MED ORDER — FENTANYL 2.5 MCG/ML BUPIVACAINE 1/10 % EPIDURAL INFUSION (WH - ANES)
14.0000 mL/h | INTRAMUSCULAR | Status: DC | PRN
  Administered 2014-11-16 (×2): 14 mL/h via EPIDURAL
  Filled 2014-11-16 (×2): qty 125

## 2014-11-16 NOTE — Anesthesia Preprocedure Evaluation (Signed)
Anesthesia Evaluation  Patient identified by MRN, date of birth, ID band Patient awake    Reviewed: Allergy & Precautions, NPO status , Patient's Chart, lab work & pertinent test results  History of Anesthesia Complications Negative for: history of anesthetic complications  Airway Mallampati: II  TM Distance: >3 FB Neck ROM: Full    Dental no notable dental hx. (+) Dental Advisory Given   Pulmonary neg pulmonary ROS,  breath sounds clear to auscultation  Pulmonary exam normal       Cardiovascular negative cardio ROS Normal cardiovascular examRhythm:Regular Rate:Normal     Neuro/Psych PSYCHIATRIC DISORDERS Depression negative neurological ROS     GI/Hepatic negative GI ROS, Neg liver ROS,   Endo/Other  obesity  Renal/GU negative Renal ROS  negative genitourinary   Musculoskeletal negative musculoskeletal ROS (+)   Abdominal   Peds negative pediatric ROS (+)  Hematology negative hematology ROS (+)   Anesthesia Other Findings   Reproductive/Obstetrics (+) Pregnancy                             Anesthesia Physical Anesthesia Plan  ASA: II  Anesthesia Plan: Epidural   Post-op Pain Management:    Induction:   Airway Management Planned:   Additional Equipment:   Intra-op Plan:   Post-operative Plan:   Informed Consent: I have reviewed the patients History and Physical, chart, labs and discussed the procedure including the risks, benefits and alternatives for the proposed anesthesia with the patient or authorized representative who has indicated his/her understanding and acceptance.   Dental advisory given  Plan Discussed with: CRNA  Anesthesia Plan Comments:         Anesthesia Quick Evaluation

## 2014-11-16 NOTE — H&P (Signed)
[name], [name] NO.:  [id number]  MEDICAL RECORD NO.:  [id number]  LOCATION:  9161                          FACILITY:  [facility]  PHYSICIAN:  Malachi Pro. Ambrose Mantle, M.D. DATE OF BIRTH:  Aug 18, 1980  DATE OF ADMISSION:  11/16/2014 DATE OF DISCHARGE:                             HISTORY & PHYSICAL   HISTORY OF PRESENT ILLNESS:  This is a 34 year old black female, para 0- 0-3-0, gravida 4, EDC November 06, 2014, admitted for induction of labor. Blood group and type O positive, negative antibody.  Urine culture negative.  Hepatitis B surface antigen positive.  HIV negative.  GC and Chlamydia negative.  Pap test normal.  Cystic fibrosis negative.  First trimester screen negative.  Second trimester screen for AFP negative. One-hour Glucola 128.  Group B strep negative.  Rubella positive. Hepatitis B surface antigen positive.  Hepatic functions were normal. Hepatitis B core antibody negative.  Hepatitis Be antibody positive. Quantitative hepatitis B virus 580.  The patient had an ultrasound on April 12, 2014, showed her to be 10 weeks and 2 days with an [facility] of November 06, 2014 and that was chosen as the due date.  Pap smear was normal with HPV negative.  After her first visit, she requested screening test.  Cystic fibrosis screening was negative.  Repeat ultrasound on April 29, 2014, was done as part of a first trimester screen.  First trimester screen was negative for trisomy 47 and Down syndrome.  AFP screen was negative.  Open spine birth defects.  Her liver function tests were normal, but the quantitative was 580, this is well below the lower limit of normal that is known to cause transmission to the baby.  Her initial weight was 205 pounds.  Recently, her weight is exploded.  She has gained a total of 26 pounds and 10 of that has been in the last 3 weeks.  Her blood pressure has remained normal.  At her last visit on August 22, she wondered if she was trickling fluid  on 2 days prior.  Fern test was negative.  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  Also, has a history of arthritis.  PAST SURGICAL HISTORY:  None.  ALLERGIES:  No latex allergies.  No food allergies and no known drug allergies.  SOCIAL HISTORY:  She never smoked.  Does not drink.  Denies drugs.  12- grade of education.  She claims to be disabled.  Partner of 4 years, name Lyman Bishop, he is disabled from a third stroke.  FAMILY HISTORY:  Maternal grandfather with diabetes and heart attack. Paternal aunt with cancer of the brain.  PHYSICAL EXAMINATION:  VITAL SIGNS:  On admission, well-developed obese black female, in no distress.  Blood pressure 120/80, weight 231.9 pounds. HEART:  Normal size and sounds.  No murmurs. LUNGS:  Clear to auscultation. ABDOMEN:  Fundal height is 41.5 cm.  The cervix is 2 cm, 30%, vertex at a -3 station.  ADMITTING IMPRESSION: 1. Intrauterine pregnancy at 41 weeks and 3 days. 2. Plaque psoriasis. 3. Hepatitis B, history.  PLAN:  She is  admitted for induction of labor.  She understands the risks and is ready to proceed.     Malachi Pro. Ambrose Mantle, M.D.     TFH/MEDQ  D:  11/15/2014  T:  11/15/2014  Job:  [id number]

## 2014-11-16 NOTE — Progress Notes (Signed)
Patient ID: [name], female   DOB: Jan 24, 1981, 34 y.o.   MRN: [id number] The FHR has shown intermittent decelerations during the labor. She became fully dilated at 9PM and during the exam a deceleration occurred. We have had her push on her side and she has made some progress and the FHR has mostly been acceptable. We have given O2 by mask. At present we do not need to effect delivery.

## 2014-11-16 NOTE — Progress Notes (Signed)
Patient ID: [name], female   DOB: 09-19-1980, 34 y.o.   MRN: [id number] The pt was admitted for induction and placed on pitocin. She received an epidural and progressed to 7 cm at 5pm. Now, she is 8 cm 100% effaced and the vertex is at - 1 station. AROM produced clear fluid.

## 2014-11-16 NOTE — Progress Notes (Signed)
Patient ID: [name], female   DOB: Aug 11, 1980, 34 y.o.   MRN: [id number] Pt was admitted and placed on pitocin. The FHR was perfectly normal. She went to the restroom and on her return there was bradycardia that responded to positioning. The FHR has returned to normal. I reviewed the strip since admission and there are no abnormalities except for the 2-3 minutes after she returned from the restroom.

## 2014-11-16 NOTE — Anesthesia Procedure Notes (Signed)
Epidural Patient location during procedure: OB  Staffing Anesthesiologist: Merit Maybee Performed by: anesthesiologist   Preanesthetic Checklist Completed: patient identified, site marked, surgical consent, pre-op evaluation, timeout performed, IV checked, risks and benefits discussed and monitors and equipment checked  Epidural Patient position: sitting Prep: site prepped and draped and DuraPrep Patient monitoring: continuous pulse ox and blood pressure Approach: midline Location: L3-L4 Injection technique: LOR saline  Needle:  Needle type: Tuohy  Needle gauge: 17 G Needle length: 9 cm and 9 Needle insertion depth: 6 cm Catheter type: closed end flexible Catheter size: 19 Gauge Catheter at skin depth: 10 cm Test dose: negative  Assessment Events: blood not aspirated, injection not painful, no injection resistance, negative IV test and no paresthesia  Additional Notes Patient identified. Risks/Benefits/Options discussed with patient including but not limited to bleeding, infection, nerve damage, paralysis, failed block, incomplete pain control, headache, blood pressure changes, nausea, vomiting, reactions to medication both or allergic, itching and postpartum back pain. Confirmed with bedside nurse the patient's most recent platelet count. Confirmed with patient that they are not currently taking any anticoagulation, have any bleeding history or any family history of bleeding disorders. Patient expressed understanding and wished to proceed. All questions were answered. Sterile technique was used throughout the entire procedure. Please see nursing notes for vital signs. Test dose was given through epidural catheter and negative prior to continuing to dose epidural or start infusion. Warning signs of high block given to the patient including shortness of breath, tingling/numbness in hands, complete motor block, or any concerning symptoms with instructions to call for help. Patient was  given instructions on fall risk and not to get out of bed. All questions and concerns addressed with instructions to call with any issues or inadequate analgesia.    

## 2014-11-17 ENCOUNTER — Encounter (HOSPITAL_COMMUNITY): Payer: Self-pay

## 2014-11-17 LAB — RPR: RPR: NONREACTIVE

## 2014-11-17 LAB — CBC
HCT: 34.1 % — ABNORMAL LOW (ref 36.0–46.0)
HCT: 29.7 % — ABNORMAL LOW (ref 36.0–46.0)
HEMOGLOBIN: 10.1 g/dL — AB (ref 12.0–15.0)
HEMOGLOBIN: 11.7 g/dL — AB (ref 12.0–15.0)
MCH: 34.4 pg — ABNORMAL HIGH (ref 26.0–34.0)
MCH: 34 pg (ref 26.0–34.0)
MCHC: 34.3 g/dL (ref 30.0–36.0)
MCHC: 34 g/dL (ref 30.0–36.0)
MCV: 100.3 fL — ABNORMAL HIGH (ref 78.0–100.0)
MCV: 100 fL (ref 78.0–100.0)
PLATELETS: 177 10*3/uL (ref 150–400)
Platelets: 195 10*3/uL (ref 150–400)
RBC: 2.97 MIL/uL — AB (ref 3.87–5.11)
RBC: 3.4 MIL/uL — AB (ref 3.87–5.11)
RDW: 14 % (ref 11.5–15.5)
RDW: 14 % (ref 11.5–15.5)
WBC: 16.6 10*3/uL — ABNORMAL HIGH (ref 4.0–10.5)
WBC: 39.8 10*3/uL — AB (ref 4.0–10.5)

## 2014-11-17 LAB — URINALYSIS, ROUTINE W REFLEX MICROSCOPIC
Bilirubin Urine: NEGATIVE
Glucose, UA: NEGATIVE mg/dL
KETONES UR: 15 mg/dL — AB
NITRITE: POSITIVE — AB
PROTEIN: 100 mg/dL — AB
Specific Gravity, Urine: 1.015 (ref 1.005–1.030)
UROBILINOGEN UA: 0.2 mg/dL (ref 0.0–1.0)
pH: 6.5 (ref 5.0–8.0)

## 2014-11-17 LAB — URINE MICROSCOPIC-ADD ON

## 2014-11-17 MED ORDER — ZOLPIDEM TARTRATE 5 MG PO TABS: 5.0000 mg | ORAL_TABLET | Freq: Every evening | ORAL | Status: DC | PRN

## 2014-11-17 MED ORDER — OXYCODONE-ACETAMINOPHEN 5-325 MG PO TABS
1.0000 | ORAL_TABLET | ORAL | Status: DC | PRN
  Administered 2014-11-18: 1 via ORAL
  Filled 2014-11-17: qty 1

## 2014-11-17 MED ORDER — IBUPROFEN 600 MG PO TABS
600.0000 mg | ORAL_TABLET | Freq: Four times a day (QID) | ORAL | Status: DC
  Administered 2014-11-17 – 2014-11-19 (×8): 600 mg via ORAL
  Filled 2014-11-17 (×8): qty 1

## 2014-11-17 MED ORDER — LACTATED RINGERS IV SOLN: INTRAVENOUS | Status: AC

## 2014-11-17 MED ORDER — FAMOTIDINE 20 MG PO TABS
10.0000 mg | ORAL_TABLET | Freq: Two times a day (BID) | ORAL | Status: DC | PRN
  Administered 2014-11-18: 10 mg via ORAL
  Filled 2014-11-17: qty 1

## 2014-11-17 MED ORDER — SIMETHICONE 80 MG PO CHEW: 80.0000 mg | CHEWABLE_TABLET | ORAL | Status: DC | PRN

## 2014-11-17 MED ORDER — PRENATAL MULTIVITAMIN CH: 1.0000 | ORAL_TABLET | Freq: Every day | ORAL | Status: DC

## 2014-11-17 MED ORDER — TETANUS-DIPHTH-ACELL PERTUSSIS 5-2.5-18.5 LF-MCG/0.5 IM SUSP: 0.5000 mL | Freq: Once | INTRAMUSCULAR | Status: DC

## 2014-11-17 MED ORDER — DIBUCAINE 1 % RE OINT: 1.0000 "application " | TOPICAL_OINTMENT | RECTAL | Status: DC | PRN

## 2014-11-17 MED ORDER — ONDANSETRON HCL 4 MG PO TABS: 4.0000 mg | ORAL_TABLET | ORAL | Status: DC | PRN

## 2014-11-17 MED ORDER — OXYCODONE-ACETAMINOPHEN 5-325 MG PO TABS: 2.0000 | ORAL_TABLET | ORAL | Status: DC | PRN

## 2014-11-17 MED ORDER — ACETAMINOPHEN 325 MG PO TABS
650.0000 mg | ORAL_TABLET | ORAL | Status: DC | PRN
  Administered 2014-11-17: 650 mg via ORAL
  Filled 2014-11-17: qty 2

## 2014-11-17 MED ORDER — ONDANSETRON HCL 4 MG/2ML IJ SOLN: 4.0000 mg | INTRAMUSCULAR | Status: DC | PRN

## 2014-11-17 MED ORDER — DIPHENHYDRAMINE HCL 25 MG PO CAPS: 25.0000 mg | ORAL_CAPSULE | Freq: Four times a day (QID) | ORAL | Status: DC | PRN

## 2014-11-17 MED ORDER — LANOLIN HYDROUS EX OINT: TOPICAL_OINTMENT | CUTANEOUS | Status: DC | PRN

## 2014-11-17 MED ORDER — MEASLES, MUMPS & RUBELLA VAC ~~LOC~~ INJ
0.5000 mL | INJECTION | Freq: Once | SUBCUTANEOUS | Status: DC
  Filled 2014-11-17: qty 0.5

## 2014-11-17 MED ORDER — SENNOSIDES-DOCUSATE SODIUM 8.6-50 MG PO TABS
2.0000 | ORAL_TABLET | ORAL | Status: DC
  Administered 2014-11-17 – 2014-11-18 (×2): 2 via ORAL
  Filled 2014-11-17 (×2): qty 2

## 2014-11-17 MED ORDER — PRENATAL MULTIVITAMIN CH
1.0000 | ORAL_TABLET | Freq: Every day | ORAL | Status: DC
  Administered 2014-11-17 – 2014-11-18 (×2): 1 via ORAL
  Filled 2014-11-17 (×2): qty 1

## 2014-11-17 MED ORDER — WITCH HAZEL-GLYCERIN EX PADS: 1.0000 "application " | MEDICATED_PAD | CUTANEOUS | Status: DC | PRN

## 2014-11-17 MED ORDER — BENZOCAINE-MENTHOL 20-0.5 % EX AERO
1.0000 "application " | INHALATION_SPRAY | CUTANEOUS | Status: DC | PRN
  Administered 2014-11-17: 1 via TOPICAL
  Filled 2014-11-17: qty 56

## 2014-11-17 MED ORDER — OXYTOCIN 40 UNITS IN LACTATED RINGERS INFUSION - SIMPLE MED: 62.5000 mL/h | INTRAVENOUS | Status: AC | PRN

## 2014-11-17 NOTE — Progress Notes (Signed)
MOB was referred for history of depression/anxiety.  Referral is screened out by Clinical Social Worker because none of the following criteria appear to apply: -History of anxiety/depression during this pregnancy, or of post-partum depression. - Diagnosis of anxiety and/or depression within last 3 years or -MOB's symptoms are currently being treated with medication and/or therapy.  Please contact the Clinical Social Worker if needs arise or upon MOB request.  

## 2014-11-17 NOTE — Anesthesia Postprocedure Evaluation (Signed)
  Anesthesia Post-op Note  Patient: [name]  Procedure(s) Performed: * No procedures listed *  Patient Location: Mother/Baby  Anesthesia Type:Epidural  Level of Consciousness: awake, alert , oriented and patient cooperative  Airway and Oxygen Therapy: Patient Spontanous Breathing  Post-op Pain: mild  Post-op Assessment: Patient's Cardiovascular Status Stable, Respiratory Function Stable, No headache, No backache and Patient able to bend at knees              Post-op Vital Signs: Reviewed and stable  Last Vitals:  Filed Vitals:   11/17/14 0405  BP: 108/77  Pulse: 68  Temp: 37.5 C  Resp: 18    Complications: No apparent anesthesia complications

## 2014-11-17 NOTE — Progress Notes (Signed)
Patient ID: [name], female   DOB: Oct 18, 1980, 34 y.o.   MRN: [id number] DOD afebrile Having fewer chilly sensations WBC 39 000 She has no complaints. The uterus does not feel tender Last elevated temp was at 3am at 100.0 degrees. Max was 102.3. Will continue to observe to decide if antibiotics are needed. Delivery was at 12:03 AM

## 2014-11-17 NOTE — Progress Notes (Signed)
Delivery of live viable female by Dr. Ambrose Mantle, APGAR 8,9

## 2014-11-17 NOTE — Progress Notes (Signed)
Patient ID: [name], female   DOB: 1980-10-01, 34 y.o.   MRN: [id number] Delivery note:  The FHR improved as the baby descended ROP regarding decelerations but the baseline FHR was consistently above 160, sometimes 190-200. The pt's temp never rose above 99.5. I had planned to have the NICU attend the delivery but she progressed to delivery so quickly we were unable to have them for delivery but they arrived at about 1 minute of age. The baby delivered ROP and there was no shoulder dystocia but the baby had a marked cone head. Apgars were 8 and 9 at 1 and 5 minutes. The placenta was intact and the uterus was normal. A small perineal laceration was repaired with 3-0 vicryl. A right labial laceration was hemostatic and not repaired. Shortly after delivery the pt c/o freezing and some difficulty with her tongue. Temp was 101.4. I ordered blood cultures and cath urine for analysis and culture. After the pt was covered with somewhat warm blankets she felt improved.At present I will observe without antibiotics EBL 300 cc's.

## 2014-11-18 LAB — URINE CULTURE: Culture: NO GROWTH

## 2014-11-18 NOTE — Progress Notes (Signed)
Patient ID: [name], female   DOB: [birth year], 34 y.o.   MRN: [id number] #1 afebrile since 3AM 11-17-14 Only complaint is soreness all over. Advised may be secondary to 3 hours of pushing. Baby is doing well

## 2014-11-19 MED ORDER — OXYCODONE-ACETAMINOPHEN 5-325 MG PO TABS: 1.0000 | ORAL_TABLET | Freq: Four times a day (QID) | ORAL | Status: DC | PRN

## 2014-11-19 MED ORDER — IBUPROFEN 600 MG PO TABS: 600.0000 mg | ORAL_TABLET | Freq: Four times a day (QID) | ORAL | Status: DC | PRN

## 2014-11-19 NOTE — Discharge Instructions (Signed)
booklet °

## 2014-11-19 NOTE — Progress Notes (Signed)
Patient ID: [name], female   DOB: 1981-01-06, 34 y.o.   MRN: [id number] #2 Afebrile for over 48 hours No complaints ready for d/c

## 2014-11-19 NOTE — Care Management Important Message (Signed)
Important Message  Patient Details  Name: [name] MRN: [id number] Date of Birth: Nov 21, 1980   Medicare Important Message Given:  Yes-second notification given    Renie Ora 11/19/2014, 11:15 AMImportant Message  Patient Details  Name: [name] MRN: [id number] Date of Birth: 04-Jan-1981   Medicare Important Message Given:  Yes-second notification given    Renie Ora 11/19/2014, 11:15 AM

## 2014-11-20 NOTE — Discharge Summary (Signed)
NAMEMarland Kitchen  INETHA, Alexis NO.:  [id number]  MEDICAL RECORD NO.:  [id number]  LOCATION:  9139                          FACILITY:  [facility]  PHYSICIAN:  Malachi Pro. Ambrose Mantle, M.D. DATE OF BIRTH:  01/21/1981  DATE OF ADMISSION:  11/16/2014 DATE OF DISCHARGE:  11/19/2014                              DISCHARGE SUMMARY   A 34 year old black female, para 0-0-3-0, gravida 4, EDC November 06, 2014, admitted for induction of labor.  The patient's hepatitis B surface antigen was positive.  She had a quantitative of 580.  This information was related to the pediatricians.  First trimester screen and AFP were negative.  One-hour Glucola was 128.  Group B strep was negative.  After admission to the hospital, the patient was placed on Pitocin.  After going to the restroom, bradycardia was noticed.  This responded to position change.  At 5 p.m., she had progressed to 7 cm. At 6:13 p.m., she was 8 cm.  Artificial rupture of the membranes produced clear fluid.  At 9:55 p.m., the patient had been pushing for 55 minutes.  Fetal heart rate had shown intermittent decelerations during the labor.  We had to have the patient push on her side in order to preserve the fetal heart rate.  The fetal heart rate improved as the baby descended ROP regarding decelerations with the baseline fetal heart rate was consistently above 160 sometimes 190-200.  The patient's temp never rose above 99.5.  NICU was called to attend delivery.  The patient actually had progressed so rapidly that the NICU was there at approximately 1 minute of age.  The baby delivered ROP.  There was no shoulder dystocia.  Baby had a marked cone head.  Apgars were 8 and 9 at 1 and 5 minutes.  Placenta was intact.  Uterus normal.  Small perineal laceration repaired with 3-0 Vicryl.  Right labial laceration was hemostatic and not repaired.  Shortly after delivery, the patient complained of freezing and some difficulty with her tongue.   Temperature was a 101.4.  I ordered blood cultures and cath urine for analysis and culture.  The patient felt like she needed more blankets.  Temperature subsequently rose to 102.3, but 3 hours after delivery, the temperature was 100, and it never went above 100 again.  Her initial hemoglobin was 12.2, hematocrit 35.6, white count 8100, platelet count 208,000. Followup hemoglobin was 11.7, hematocrit 34.1, white count 16,600, and a final hemoglobin was 10.1 with a white count of 39,800.  At the present time, the patient is afebrile and has been for over 48 hours without treatment.  She has no complaints and is ready for discharge.  Other laboratory data, blood culture showed no growth at 1 day and is negative to date.  Her urine culture showed no growth.  FINAL DIAGNOSES:  Intrauterine pregnancy at 41+ weeks gestation. Delivered ROP.  Chronic hepatitis B.  Prolonged second stage of labor. Postpartum fever, probably secondary to chorioamnionitis.  OPERATION:  Spontaneous delivery ROP, repair of small perineal laceration.  FINAL CONDITION:  Improved.  INSTRUCTIONS:  Include our regular discharge instruction booklet as well as the after visit summary.  Prescriptions for Motrin 600 mg 30  tablets, 1 every 6 hours as needed for pain and Percocet 5/325, 30 tablets, 1 every 6 hours as needed for pain.  The patient is advised to return to the office in 6 weeks for followup examination.  She is advised to call the office with her desired method of birth control so that it can be approved prior to her visit and to avoid intercourse until her visit so that the question of pregnancy is not up.     Malachi Pro. Ambrose Mantle, M.D.     TFH/MEDQ  D:  11/19/2014  T:  11/20/2014  Job:  [id number]

## 2014-11-22 LAB — CULTURE, BLOOD (ROUTINE X 2)
CULTURE: NO GROWTH
CULTURE: NO GROWTH

## 2014-12-02 DIAGNOSIS — F341 Dysthymic disorder: Secondary | ICD-10-CM | POA: Diagnosis not present

## 2014-12-17 DIAGNOSIS — F341 Dysthymic disorder: Secondary | ICD-10-CM | POA: Diagnosis not present

## 2015-01-14 DIAGNOSIS — F341 Dysthymic disorder: Secondary | ICD-10-CM | POA: Diagnosis not present

## 2015-02-11 DIAGNOSIS — F341 Dysthymic disorder: Secondary | ICD-10-CM | POA: Diagnosis not present

## 2015-03-04 DIAGNOSIS — F341 Dysthymic disorder: Secondary | ICD-10-CM | POA: Diagnosis not present

## 2015-03-17 DIAGNOSIS — F341 Dysthymic disorder: Secondary | ICD-10-CM | POA: Diagnosis not present

## 2015-04-14 DIAGNOSIS — F341 Dysthymic disorder: Secondary | ICD-10-CM | POA: Diagnosis not present

## 2015-04-28 DIAGNOSIS — F341 Dysthymic disorder: Secondary | ICD-10-CM | POA: Diagnosis not present

## 2015-05-26 DIAGNOSIS — F341 Dysthymic disorder: Secondary | ICD-10-CM | POA: Diagnosis not present

## 2015-06-09 DIAGNOSIS — F341 Dysthymic disorder: Secondary | ICD-10-CM | POA: Diagnosis not present

## 2015-06-23 DIAGNOSIS — F341 Dysthymic disorder: Secondary | ICD-10-CM | POA: Diagnosis not present

## 2015-08-04 DIAGNOSIS — F341 Dysthymic disorder: Secondary | ICD-10-CM | POA: Diagnosis not present

## 2015-08-18 DIAGNOSIS — F341 Dysthymic disorder: Secondary | ICD-10-CM | POA: Diagnosis not present

## 2015-08-18 DIAGNOSIS — L400 Psoriasis vulgaris: Secondary | ICD-10-CM | POA: Diagnosis not present

## 2015-09-01 DIAGNOSIS — F341 Dysthymic disorder: Secondary | ICD-10-CM | POA: Diagnosis not present

## 2015-09-15 DIAGNOSIS — F341 Dysthymic disorder: Secondary | ICD-10-CM | POA: Diagnosis not present

## 2015-09-19 DIAGNOSIS — F341 Dysthymic disorder: Secondary | ICD-10-CM | POA: Diagnosis not present

## 2015-09-29 DIAGNOSIS — F341 Dysthymic disorder: Secondary | ICD-10-CM | POA: Diagnosis not present

## 2015-10-10 DIAGNOSIS — L409 Psoriasis, unspecified: Secondary | ICD-10-CM | POA: Diagnosis not present

## 2015-10-12 DIAGNOSIS — F341 Dysthymic disorder: Secondary | ICD-10-CM | POA: Diagnosis not present

## 2015-10-13 DIAGNOSIS — F341 Dysthymic disorder: Secondary | ICD-10-CM | POA: Diagnosis not present

## 2015-10-20 DIAGNOSIS — F341 Dysthymic disorder: Secondary | ICD-10-CM | POA: Diagnosis not present

## 2015-11-03 DIAGNOSIS — F341 Dysthymic disorder: Secondary | ICD-10-CM | POA: Diagnosis not present

## 2015-11-16 ENCOUNTER — Encounter (INDEPENDENT_AMBULATORY_CARE_PROVIDER_SITE_OTHER): Payer: Self-pay | Admitting: Internal Medicine

## 2015-11-16 ENCOUNTER — Encounter (INDEPENDENT_AMBULATORY_CARE_PROVIDER_SITE_OTHER): Payer: Self-pay

## 2015-11-23 ENCOUNTER — Ambulatory Visit (INDEPENDENT_AMBULATORY_CARE_PROVIDER_SITE_OTHER): Payer: Medicare Other | Admitting: Internal Medicine

## 2015-11-23 ENCOUNTER — Encounter (INDEPENDENT_AMBULATORY_CARE_PROVIDER_SITE_OTHER): Payer: Self-pay | Admitting: Internal Medicine

## 2015-11-23 VITALS — BP 140/90 | HR 72 | Temp 97.2°F | Ht 66.0 in | Wt 169.9 lb

## 2015-11-23 DIAGNOSIS — B161 Acute hepatitis B with delta-agent without hepatic coma: Secondary | ICD-10-CM | POA: Diagnosis not present

## 2015-11-23 DIAGNOSIS — B1910 Unspecified viral hepatitis B without hepatic coma: Secondary | ICD-10-CM

## 2015-11-23 NOTE — Patient Instructions (Signed)
OV in 3 months. 

## 2015-11-23 NOTE — Progress Notes (Signed)
   Subjective:    Patient ID: [name], female    DOB: [birth year], 35 y.o.   MRN: [id number]  HPI Here today for Hepatitis B. Referred by Dr. Ambrose Mantle.,  Per records her HepBSAg is positive, Hepatitis B surface antibody negative, He B core Ab IgM negative, Hep BE ab Positive, Hep B core Ab total and Hep Be Ag negative.Marland Kitchen   No IV drugs.No multiple sex partners. She has one tattoo. She was diagnosed with Hepatitis B x 10 yrs. 'She has never been jaundiced. She cannot tell me if she received the Hep B vaccine.  Hx of psoriasis and has been on Humira. Stopped due to bump in her liver enzymes.   10/20/2014 Hep B AB,total  Positive, Hep BE AB: positive Hep B core AB, IGM negative Hep B surface AB, qual  Non reactive  total bili less than 0.2, ALP 112, AST 15, ALT 14          Review of Systems Past Medical History:  Diagnosis Date  . Depression   . Hepatitis B    carrier  . Psoriasis     Past Surgical History:  Procedure Laterality Date  . NO PAST SURGERIES    . WISDOM TOOTH EXTRACTION      No Known Allergies  Current Outpatient Prescriptions on File Prior to Visit  Medication Sig Dispense Refill  . ibuprofen (ADVIL,MOTRIN) 600 MG tablet Take 1 tablet (600 mg total) by mouth every 6 (six) hours as needed. (Patient not taking: Reported on 11/23/2015) 30 tablet 0  . oxyCODONE-acetaminophen (PERCOCET/ROXICET) 5-325 MG per tablet Take 1 tablet by mouth every 6 (six) hours as needed (for pain scale 4-7). (Patient not taking: Reported on 11/23/2015) 30 tablet 0  . Prenatal Vit-Fe Fumarate-FA (PRENATAL MULTIVITAMIN) TABS tablet Take 1 tablet by mouth daily at 12 noon.     No current facility-administered medications on file prior to visit.        Objective:   Physical Exam Blood pressure 140/90, pulse 72, temperature 97.2 F (36.2 C), height 5\' 6"  (1.676 m), weight 169 lb 14.4 oz (77.1 kg), unknown if currently breastfeeding. Alert and oriented. Skin warm and dry. Oral mucosa is  moist.   . Sclera anicteric, conjunctivae is pink. Thyroid not enlarged. No cervical lymphadenopathy. Lungs clear. Heart regular rate and rhythm.  Abdomen is soft. Bowel sounds are positive. No hepatomegaly. No abdominal masses felt. No tenderness.  No edema to lower extremities.  .       Assessment & Plan:  Hepatitis B. Am going to get labs. OV in 3 months.  Hep B surface antigen, Hep B surface antibody, Hep B quaint, CBC and Hepatic function.  OV in 3 months

## 2015-11-24 ENCOUNTER — Encounter (INDEPENDENT_AMBULATORY_CARE_PROVIDER_SITE_OTHER): Payer: Self-pay

## 2015-11-24 LAB — CBC WITH DIFFERENTIAL/PLATELET
BASOS PCT: 3 %
Basophils Absolute: 102 cells/uL (ref 0–200)
EOS PCT: 1 %
Eosinophils Absolute: 34 cells/uL (ref 15–500)
HCT: 45.5 % — ABNORMAL HIGH (ref 35.0–45.0)
Hemoglobin: 15.3 g/dL (ref 11.7–15.5)
LYMPHS PCT: 22 %
Lymphs Abs: 748 cells/uL — ABNORMAL LOW (ref 850–3900)
MCH: 33.5 pg — AB (ref 27.0–33.0)
MCHC: 33.6 g/dL (ref 32.0–36.0)
MCV: 99.6 fL (ref 80.0–100.0)
MONOS PCT: 12 %
MPV: 10.2 fL (ref 7.5–12.5)
Monocytes Absolute: 408 cells/uL (ref 200–950)
NEUTROS ABS: 2108 {cells}/uL (ref 1500–7800)
Neutrophils Relative %: 62 %
PLATELETS: 271 10*3/uL (ref 140–400)
RBC: 4.57 MIL/uL (ref 3.80–5.10)
RDW: 12.7 % (ref 11.0–15.0)
WBC: 3.4 10*3/uL — ABNORMAL LOW (ref 3.8–10.8)

## 2015-11-24 LAB — HEPATIC FUNCTION PANEL
ALBUMIN: 4.4 g/dL (ref 3.6–5.1)
ALK PHOS: 58 U/L (ref 33–115)
ALT: 42 U/L — ABNORMAL HIGH (ref 6–29)
AST: 123 U/L — ABNORMAL HIGH (ref 10–30)
BILIRUBIN TOTAL: 1 mg/dL (ref 0.2–1.2)
Bilirubin, Direct: 0.3 mg/dL — ABNORMAL HIGH (ref <=0.2)
Indirect Bilirubin: 0.7 mg/dL (ref 0.2–1.2)
TOTAL PROTEIN: 7.7 g/dL (ref 6.1–8.1)

## 2015-11-24 LAB — HEPATITIS B SURFACE ANTIBODY,QUALITATIVE: HEP B S AB: NEGATIVE

## 2015-11-24 LAB — HEPATITIS B SURF AG CONFIRMATION: Hepatitis B Surf Ag Confirmation: POSITIVE — AB

## 2015-11-24 LAB — HEPATITIS B SURFACE ANTIGEN: Hepatitis B Surface Ag: POSITIVE — AB

## 2015-11-29 LAB — HEPATITIS B DNA, ULTRAQUANTITATIVE, PCR
HEPATITIS B DNA (CALC): 3.74 {Log_IU}/mL — AB (ref <1.30)
HEPATITIS B DNA: 5459 [IU]/mL — AB (ref <20)

## 2015-12-01 DIAGNOSIS — F341 Dysthymic disorder: Secondary | ICD-10-CM | POA: Diagnosis not present

## 2015-12-05 ENCOUNTER — Telehealth (INDEPENDENT_AMBULATORY_CARE_PROVIDER_SITE_OTHER): Payer: Self-pay | Admitting: Internal Medicine

## 2015-12-05 DIAGNOSIS — B1910 Unspecified viral hepatitis B without hepatic coma: Secondary | ICD-10-CM

## 2015-12-05 NOTE — Telephone Encounter (Signed)
[name], US abdomen: Hepatitis C.

## 2015-12-05 NOTE — Telephone Encounter (Signed)
US abdomen 

## 2015-12-05 NOTE — Telephone Encounter (Signed)
[place] sch'd 12/09/15 at 10 (945), npo after midnight, patient aware

## 2015-12-06 DIAGNOSIS — F411 Generalized anxiety disorder: Secondary | ICD-10-CM | POA: Diagnosis not present

## 2015-12-06 DIAGNOSIS — F341 Dysthymic disorder: Secondary | ICD-10-CM | POA: Diagnosis not present

## 2015-12-06 NOTE — Telephone Encounter (Signed)
error 

## 2015-12-07 DIAGNOSIS — F341 Dysthymic disorder: Secondary | ICD-10-CM | POA: Diagnosis not present

## 2015-12-08 DIAGNOSIS — F341 Dysthymic disorder: Secondary | ICD-10-CM | POA: Diagnosis not present

## 2015-12-09 ENCOUNTER — Ambulatory Visit (HOSPITAL_COMMUNITY)
Admission: RE | Admit: 2015-12-09 | Discharge: 2015-12-09 | Disposition: A | Discharge status: Home / Self Care | Payer: Medicare Other | Source: Ambulatory Visit | Attending: Internal Medicine | Admitting: Internal Medicine

## 2015-12-09 DIAGNOSIS — I77811 Abdominal aortic ectasia: Secondary | ICD-10-CM | POA: Insufficient documentation

## 2015-12-09 DIAGNOSIS — B1910 Unspecified viral hepatitis B without hepatic coma: Secondary | ICD-10-CM | POA: Diagnosis not present

## 2015-12-13 ENCOUNTER — Telehealth (INDEPENDENT_AMBULATORY_CARE_PROVIDER_SITE_OTHER): Payer: Self-pay | Admitting: Internal Medicine

## 2015-12-13 ENCOUNTER — Other Ambulatory Visit (INDEPENDENT_AMBULATORY_CARE_PROVIDER_SITE_OTHER): Payer: Self-pay | Admitting: Internal Medicine

## 2015-12-13 DIAGNOSIS — B1910 Unspecified viral hepatitis B without hepatic coma: Secondary | ICD-10-CM

## 2015-12-13 DIAGNOSIS — F341 Dysthymic disorder: Secondary | ICD-10-CM | POA: Diagnosis not present

## 2015-12-13 DIAGNOSIS — F411 Generalized anxiety disorder: Secondary | ICD-10-CM | POA: Diagnosis not present

## 2015-12-13 NOTE — Progress Notes (Signed)
Liver biopsy ordered.

## 2015-12-14 DIAGNOSIS — F341 Dysthymic disorder: Secondary | ICD-10-CM | POA: Diagnosis not present

## 2015-12-15 DIAGNOSIS — F341 Dysthymic disorder: Secondary | ICD-10-CM | POA: Diagnosis not present

## 2015-12-16 NOTE — Telephone Encounter (Signed)
error 

## 2015-12-19 ENCOUNTER — Other Ambulatory Visit: Payer: Self-pay | Admitting: Radiology

## 2015-12-20 ENCOUNTER — Encounter (HOSPITAL_COMMUNITY): Payer: Self-pay

## 2015-12-20 ENCOUNTER — Ambulatory Visit (HOSPITAL_COMMUNITY)
Admission: RE | Admit: 2015-12-20 | Discharge: 2015-12-20 | Disposition: A | Discharge status: Home / Self Care | Payer: Medicare Other | Source: Ambulatory Visit | Attending: Internal Medicine | Admitting: Internal Medicine

## 2015-12-20 DIAGNOSIS — R945 Abnormal results of liver function studies: Secondary | ICD-10-CM | POA: Diagnosis not present

## 2015-12-20 DIAGNOSIS — F329 Major depressive disorder, single episode, unspecified: Secondary | ICD-10-CM | POA: Diagnosis not present

## 2015-12-20 DIAGNOSIS — B1910 Unspecified viral hepatitis B without hepatic coma: Secondary | ICD-10-CM

## 2015-12-20 DIAGNOSIS — B181 Chronic viral hepatitis B without delta-agent: Secondary | ICD-10-CM | POA: Diagnosis not present

## 2015-12-20 DIAGNOSIS — Z7982 Long term (current) use of aspirin: Secondary | ICD-10-CM | POA: Diagnosis not present

## 2015-12-20 DIAGNOSIS — L409 Psoriasis, unspecified: Secondary | ICD-10-CM | POA: Diagnosis not present

## 2015-12-20 HISTORY — DX: Cardiac murmur, unspecified: R01.1

## 2015-12-20 LAB — PROTIME-INR
INR: 1.03
PROTHROMBIN TIME: 13.5 s (ref 11.4–15.2)

## 2015-12-20 LAB — CBC
HEMATOCRIT: 45.6 % (ref 36.0–46.0)
Hemoglobin: 15.1 g/dL — ABNORMAL HIGH (ref 12.0–15.0)
MCH: 33.1 pg (ref 26.0–34.0)
MCHC: 33.1 g/dL (ref 30.0–36.0)
MCV: 100 fL (ref 78.0–100.0)
PLATELETS: 275 10*3/uL (ref 150–400)
RBC: 4.56 MIL/uL (ref 3.87–5.11)
RDW: 12.5 % (ref 11.5–15.5)
WBC: 3.3 10*3/uL — AB (ref 4.0–10.5)

## 2015-12-20 LAB — HCG, SERUM, QUALITATIVE: PREG SERUM: NEGATIVE

## 2015-12-20 LAB — APTT: APTT: 29 s (ref 24–36)

## 2015-12-20 MED ORDER — FENTANYL CITRATE (PF) 100 MCG/2ML IJ SOLN
INTRAMUSCULAR | Status: AC
  Filled 2015-12-20: qty 2

## 2015-12-20 MED ORDER — MIDAZOLAM HCL 2 MG/2ML IJ SOLN
INTRAMUSCULAR | Status: AC | PRN
  Administered 2015-12-20: 1 mg via INTRAVENOUS
  Administered 2015-12-20: 0.5 mg via INTRAVENOUS

## 2015-12-20 MED ORDER — SODIUM CHLORIDE 0.9 % IV SOLN: INTRAVENOUS | Status: DC

## 2015-12-20 MED ORDER — SODIUM CHLORIDE 0.9 % IV SOLN
INTRAVENOUS | Status: AC | PRN
  Administered 2015-12-20: 10 mL/h via INTRAVENOUS

## 2015-12-20 MED ORDER — GELATIN ABSORBABLE 12-7 MM EX MISC
CUTANEOUS | Status: DC
Start: 2015-12-20 — End: 2015-12-21
  Filled 2015-12-20: qty 1

## 2015-12-20 MED ORDER — MIDAZOLAM HCL 2 MG/2ML IJ SOLN
INTRAMUSCULAR | Status: DC
Start: 2015-12-20 — End: 2015-12-21
  Filled 2015-12-20: qty 2

## 2015-12-20 MED ORDER — FENTANYL CITRATE (PF) 100 MCG/2ML IJ SOLN
INTRAMUSCULAR | Status: AC | PRN
  Administered 2015-12-20: 25 ug via INTRAVENOUS
  Administered 2015-12-20: 50 ug via INTRAVENOUS

## 2015-12-20 MED ORDER — LIDOCAINE HCL 1 % IJ SOLN
INTRAMUSCULAR | Status: AC
  Filled 2015-12-20: qty 20

## 2015-12-20 MED ORDER — LIDOCAINE-EPINEPHRINE 1 %-1:100000 IJ SOLN
INTRAMUSCULAR | Status: AC
  Filled 2015-12-20: qty 1

## 2015-12-20 NOTE — Progress Notes (Signed)
Per Unknown Jim, PA no bmp/cmet needed prior to procedure, will draw other labs as ordered.

## 2015-12-20 NOTE — H&P (Signed)
    Chief Complaint: elevated liver function tests with chronic hep B  Referring Physician: Rocco Serene, NP  Supervising Physician: Simonne Come  Patient Status: Out-pt  HPI: [name] is an 35 y.o. female with a history of hepatitis B for the last 10 years.  It is unknown how she got this.  She has been referred to a GI clinic in [facility].  Repeat labs were drawn, but a due to elevated AST/ALT a request for a random liver biopsy has been made.  The patient presents today for this procedure.  She has no other complaints.  Past Medical History:  Past Medical History:  Diagnosis Date  . Depression   . Heart murmur   . Hepatitis B    carrier  . Psoriasis     Past Surgical History:  Past Surgical History:  Procedure Laterality Date  . NO PAST SURGERIES    . WISDOM TOOTH EXTRACTION      Family History:  Family History  Problem Relation Age of Onset  . Cancer Maternal Aunt   . Diabetes Maternal Grandfather   . Cancer Paternal Grandfather     Social History:  reports that she has never smoked. She has never used smokeless tobacco. She reports that she drinks alcohol. She reports that she does not use drugs.  Allergies: No Known Allergies  Medications: Medications reviewed in Epic  Please HPI for pertinent positives, otherwise complete 10 system ROS negative.  Mallampati Score: MD Evaluation Airway: WNL Heart: WNL Abdomen: WNL Chest/ Lungs: WNL ASA  Classification: 2 Mallampati/Airway Score: One  Physical Exam: BP (!) 139/98   Pulse 71   Temp 97.7 F (36.5 C)   Resp 16   Ht 5\' 7"  (1.702 m)   Wt 173 lb (78.5 kg)   LMP 12/16/2015   SpO2 100%   BMI 27.10 kg/m  Body mass index is 27.1 kg/m. General: pleasant, WD, WN black female who is laying in bed in NAD HEENT: head is normocephalic, atraumatic.  Sclera are noninjected.  PERRL.  Ears and nose without any masses or lesions.  Mouth is pink and moist Heart: regular, rate, and rhythm.  Normal  s1,s2. No obvious murmurs, gallops, or rubs noted.  Palpable radial and pedal pulses bilaterally Lungs: CTAB, no wheezes, rhonchi, or rales noted.  Respiratory effort nonlabored Abd: soft, NT, ND, +BS, no masses, hernias, or organomegaly Skin: warm and dry and with some patchy areas mostly on her legs secondary to psoriasis Psych: A&Ox3 with an appropriate affect.   Labs: Pending  Imaging: No results found.  Assessment/Plan 1. Chronic hepatitis B and elevated liver function test -we will proceed with a random liver biopsy today -labs are pending, but vitals have been reviewed -Risks and Benefits discussed with the patient including, but not limited to bleeding, infection, damage to adjacent structures or low yield requiring additional tests. All of the patient's questions were answered, patient is agreeable to proceed. Consent signed and in chart.   Thank you for this interesting consult.  I greatly enjoyed meeting YOUNG BRIM and look forward to participating in their care.  A copy of this report was sent to the requesting provider on this date.  Electronically Signed: Lavena Stanford 12/20/2015, 12:31 PM   I spent a total of  30 Minutes   in face to face in clinical consultation, greater than 50% of which was counseling/coordinating care for hepatitis B, elevated LFTS

## 2015-12-20 NOTE — Procedures (Signed)
Technically successful US guided biopsy of right lobe of the liver.   EBL: None No immediate complications.   Jay Nyko Gell, MD Pager #: [phone]   

## 2015-12-20 NOTE — Discharge Instructions (Signed)
Liver Biopsy, Care After °Refer to this sheet in the next few weeks. These instructions provide you with information on caring for yourself after your procedure. Your health care provider may also give you more specific instructions. Your treatment has been planned according to current medical practices, but problems sometimes occur. Call your health care provider if you have any problems or questions after your procedure. °WHAT TO EXPECT AFTER THE PROCEDURE °After your procedure, it is typical to have the following: °· A small amount of discomfort in the area where the biopsy was done and in the right shoulder or shoulder blade. °· A small amount of bruising around the area where the biopsy was done and on the skin over the liver. °· Sleepiness and fatigue for the rest of the day. °HOME CARE INSTRUCTIONS  °· Rest at home for 1-2 days or as directed by your health care provider. °· Have a friend or family member stay with you for at least 24 hours. °· Because of the medicines used during the procedure, you should not do the following things in the first 24 hours: °¨ Drive. °¨ Use machinery. °¨ Be responsible for the care of other people. °¨ Sign legal documents. °¨ Take a bath or shower. °· There are many different ways to close and cover an incision, including stitches, skin glue, and adhesive strips. Follow your health care provider's instructions on: °¨ Incision care. °¨ Bandage (dressing) changes and removal. °¨ Incision closure removal. °· Do not drink alcohol in the first week. °· Do not lift more than 5 pounds or play contact sports for 2 weeks after this test. °· Take medicines only as directed by your health care provider. Do not take medicine containing aspirin or non-steroidal anti-inflammatory medicines such as ibuprofen for 1 week after this test. °· It is your responsibility to get your test results. °SEEK MEDICAL CARE IF:  °· You have increased bleeding from an incision that results in more than a  small spot of blood. °· You have redness, swelling, or increasing pain in any incisions. °· You notice a discharge or a bad smell coming from any of your incisions. °· You have a fever or chills. °SEEK IMMEDIATE MEDICAL CARE IF:  °· You develop swelling, bloating, or pain in your abdomen. °· You become dizzy or faint. °· You develop a rash. °· You are nauseous or vomit. °· You have difficulty breathing, feel short of breath, or feel faint. °· You develop chest pain. °· You have problems with your speech or vision. °· You have trouble balancing or moving your arms or legs. °  °This information is not intended to replace advice given to you by your health care provider. Make sure you discuss any questions you have with your health care provider. °  °Document Released: 09/29/2004 Document Revised: 04/02/2014 Document Reviewed: 05/08/2013 °Elsevier Interactive Patient Education ©2016 [employer] ° °

## 2015-12-27 DIAGNOSIS — F411 Generalized anxiety disorder: Secondary | ICD-10-CM | POA: Diagnosis not present

## 2015-12-27 DIAGNOSIS — F341 Dysthymic disorder: Secondary | ICD-10-CM | POA: Diagnosis not present

## 2015-12-29 ENCOUNTER — Telehealth (INDEPENDENT_AMBULATORY_CARE_PROVIDER_SITE_OTHER): Payer: Self-pay | Admitting: Internal Medicine

## 2015-12-29 DIAGNOSIS — F341 Dysthymic disorder: Secondary | ICD-10-CM | POA: Diagnosis not present

## 2015-12-29 NOTE — Telephone Encounter (Signed)
Patient called and would like her results.  [phone]

## 2015-12-29 NOTE — Telephone Encounter (Signed)
Message left on answering [facility]

## 2016-01-03 ENCOUNTER — Telehealth (INDEPENDENT_AMBULATORY_CARE_PROVIDER_SITE_OTHER): Payer: Self-pay | Admitting: Internal Medicine

## 2016-01-03 NOTE — Telephone Encounter (Signed)
Patient called, wanted to discuss recent test and wants to know if she can start Humira.  [phone]

## 2016-01-03 NOTE — Telephone Encounter (Signed)
I have spoke with patient. I will talk with Dr. Karilyn Cota when he returns

## 2016-01-05 DIAGNOSIS — F341 Dysthymic disorder: Secondary | ICD-10-CM | POA: Diagnosis not present

## 2016-01-05 DIAGNOSIS — F411 Generalized anxiety disorder: Secondary | ICD-10-CM | POA: Diagnosis not present

## 2016-01-12 DIAGNOSIS — F341 Dysthymic disorder: Secondary | ICD-10-CM | POA: Diagnosis not present

## 2016-01-13 ENCOUNTER — Telehealth (INDEPENDENT_AMBULATORY_CARE_PROVIDER_SITE_OTHER): Payer: Self-pay | Admitting: Internal Medicine

## 2016-01-13 ENCOUNTER — Other Ambulatory Visit (INDEPENDENT_AMBULATORY_CARE_PROVIDER_SITE_OTHER): Payer: Self-pay | Admitting: *Deleted

## 2016-01-13 DIAGNOSIS — B161 Acute hepatitis B with delta-agent without hepatic coma: Secondary | ICD-10-CM

## 2016-01-13 MED ORDER — ENTECAVIR 0.5 MG PO TABS: 0.5000 mg | ORAL_TABLET | Freq: Every day | ORAL | 11 refills | Status: DC

## 2016-01-13 NOTE — Telephone Encounter (Signed)
I called the patient, changed her appointment to 04/03/16 at 10:15am.

## 2016-01-13 NOTE — Telephone Encounter (Signed)
Baraclude sent to her pharmacy.

## 2016-01-13 NOTE — Telephone Encounter (Signed)
Patient's lab is noted for 3 months and a letter will be sent as a reminder to the patient.

## 2016-01-13 NOTE — Telephone Encounter (Signed)
Hope, Cancel November apt. She will need one in January.   Tammy, CMET 3 months.

## 2016-02-21 DIAGNOSIS — L400 Psoriasis vulgaris: Secondary | ICD-10-CM | POA: Diagnosis not present

## 2016-02-23 ENCOUNTER — Ambulatory Visit (INDEPENDENT_AMBULATORY_CARE_PROVIDER_SITE_OTHER): Payer: Medicare Other | Admitting: Internal Medicine

## 2016-03-08 DIAGNOSIS — F341 Dysthymic disorder: Secondary | ICD-10-CM | POA: Diagnosis not present

## 2016-03-15 DIAGNOSIS — F341 Dysthymic disorder: Secondary | ICD-10-CM | POA: Diagnosis not present

## 2016-03-21 ENCOUNTER — Encounter (INDEPENDENT_AMBULATORY_CARE_PROVIDER_SITE_OTHER): Payer: Self-pay | Admitting: *Deleted

## 2016-03-21 ENCOUNTER — Other Ambulatory Visit (INDEPENDENT_AMBULATORY_CARE_PROVIDER_SITE_OTHER): Payer: Self-pay | Admitting: *Deleted

## 2016-03-21 DIAGNOSIS — B161 Acute hepatitis B with delta-agent without hepatic coma: Secondary | ICD-10-CM

## 2016-03-22 DIAGNOSIS — F341 Dysthymic disorder: Secondary | ICD-10-CM | POA: Diagnosis not present

## 2016-04-03 ENCOUNTER — Ambulatory Visit (INDEPENDENT_AMBULATORY_CARE_PROVIDER_SITE_OTHER): Payer: Medicare Other | Admitting: Internal Medicine

## 2016-04-04 ENCOUNTER — Other Ambulatory Visit (INDEPENDENT_AMBULATORY_CARE_PROVIDER_SITE_OTHER): Payer: Self-pay | Admitting: *Deleted

## 2016-04-04 DIAGNOSIS — B181 Chronic viral hepatitis B without delta-agent: Secondary | ICD-10-CM

## 2016-04-17 DIAGNOSIS — B161 Acute hepatitis B with delta-agent without hepatic coma: Secondary | ICD-10-CM | POA: Diagnosis not present

## 2016-04-17 DIAGNOSIS — B181 Chronic viral hepatitis B without delta-agent: Secondary | ICD-10-CM | POA: Diagnosis not present

## 2016-04-17 LAB — CBC
HCT: 40.7 % (ref 35.0–45.0)
Hemoglobin: 13.5 g/dL (ref 11.7–15.5)
MCH: 33.4 pg — ABNORMAL HIGH (ref 27.0–33.0)
MCHC: 33.2 g/dL (ref 32.0–36.0)
MCV: 100.7 fL — ABNORMAL HIGH (ref 80.0–100.0)
MPV: 8.8 fL (ref 7.5–12.5)
PLATELETS: 290 10*3/uL (ref 140–400)
RBC: 4.04 MIL/uL (ref 3.80–5.10)
RDW: 13.2 % (ref 11.0–15.0)
WBC: 3.8 10*3/uL (ref 3.8–10.8)

## 2016-04-17 LAB — COMPREHENSIVE METABOLIC PANEL
ALT: 15 U/L (ref 6–29)
AST: 32 U/L — ABNORMAL HIGH (ref 10–30)
Albumin: 4.1 g/dL (ref 3.6–5.1)
Alkaline Phosphatase: 47 U/L (ref 33–115)
BUN: 8 mg/dL (ref 7–25)
CHLORIDE: 101 mmol/L (ref 98–110)
CO2: 29 mmol/L (ref 20–31)
Calcium: 8.9 mg/dL (ref 8.6–10.2)
Creat: 0.63 mg/dL (ref 0.50–1.10)
GLUCOSE: 78 mg/dL (ref 65–99)
POTASSIUM: 3.9 mmol/L (ref 3.5–5.3)
Sodium: 137 mmol/L (ref 135–146)
Total Bilirubin: 0.3 mg/dL (ref 0.2–1.2)
Total Protein: 7.4 g/dL (ref 6.1–8.1)

## 2016-04-21 LAB — HEPATITIS B DNA, ULTRAQUANTITATIVE, PCR

## 2016-04-26 DIAGNOSIS — F341 Dysthymic disorder: Secondary | ICD-10-CM | POA: Diagnosis not present

## 2016-05-02 ENCOUNTER — Ambulatory Visit (INDEPENDENT_AMBULATORY_CARE_PROVIDER_SITE_OTHER): Payer: Medicare Other | Admitting: Internal Medicine

## 2016-05-02 ENCOUNTER — Encounter (INDEPENDENT_AMBULATORY_CARE_PROVIDER_SITE_OTHER): Payer: Self-pay | Admitting: Internal Medicine

## 2016-05-10 DIAGNOSIS — F341 Dysthymic disorder: Secondary | ICD-10-CM | POA: Diagnosis not present

## 2016-05-15 ENCOUNTER — Ambulatory Visit (INDEPENDENT_AMBULATORY_CARE_PROVIDER_SITE_OTHER): Payer: Medicare Other | Admitting: Internal Medicine

## 2016-05-15 ENCOUNTER — Encounter (INDEPENDENT_AMBULATORY_CARE_PROVIDER_SITE_OTHER): Payer: Self-pay | Admitting: Internal Medicine

## 2016-05-15 ENCOUNTER — Telehealth (INDEPENDENT_AMBULATORY_CARE_PROVIDER_SITE_OTHER): Payer: Self-pay | Admitting: Internal Medicine

## 2016-05-15 VITALS — BP 142/90 | HR 64 | Temp 98.0°F | Ht 67.0 in | Wt 183.8 lb

## 2016-05-15 DIAGNOSIS — B181 Chronic viral hepatitis B without delta-agent: Secondary | ICD-10-CM

## 2016-05-15 LAB — HEPATIC FUNCTION PANEL
ALBUMIN: 3.7 g/dL (ref 3.6–5.1)
ALK PHOS: 59 U/L (ref 33–115)
ALT: 31 U/L — ABNORMAL HIGH (ref 6–29)
AST: 97 U/L — AB (ref 10–30)
BILIRUBIN DIRECT: 0.1 mg/dL (ref <=0.2)
BILIRUBIN INDIRECT: 0.2 mg/dL (ref 0.2–1.2)
BILIRUBIN TOTAL: 0.3 mg/dL (ref 0.2–1.2)
Total Protein: 7.1 g/dL (ref 6.1–8.1)

## 2016-05-15 NOTE — Patient Instructions (Signed)
OV in 6 months. 

## 2016-05-15 NOTE — Telephone Encounter (Signed)
Tammy, could u check on her insurance. She says the Wyvonne Lenz is [phone] with her insurance.

## 2016-05-15 NOTE — Progress Notes (Signed)
   Subjective:    Patient ID: [name], female    DOB: 04-12-1980, 36 y.o.   MRN: [id number]  HPI Here today for f/u of her Hepatitis B.  Diagnosed x 10 yrs ago. Started Entecavir 0.5mg  In October. Stopped the medication about a month ago due to the expense. She says out of pocket the cost $370. Liver biopsy in September 2017  revealed benign liver showing mild chronic hepatitis B, Batts-Ludwig inflammatory Grade 2, Fibrosis 1. Background steatohepatitis.  Her appetite is good. She has gained 14 pounds. Her BMs are normal. No melena or BRRB.  04/17/2016 Hep B DNA less than 20. Hep B DNA (calc) less than 1.30.  10/20/2014 Hep B AB,total  Positive, Hep BE AB: positive Hep B core AB, IGM negative Hep B surface AB, qual  Non reactive   Review of Systems Past Medical History:  Diagnosis Date  . Depression   . Heart murmur   . Hepatitis B    carrier  . Psoriasis     Past Surgical History:  Procedure Laterality Date  . NO PAST SURGERIES    . WISDOM TOOTH EXTRACTION      No Known Allergies  Current Outpatient Prescriptions on File Prior to Visit  Medication Sig Dispense Refill  . entecavir (BARACLUDE) 0.5 MG tablet Take 1 tablet (0.5 mg total) by mouth daily. 30 tablet 11  . fluocinolone (SYNALAR) 0.01 % external solution Apply 1 application topically See admin instructions. Apply to scalp 3 times daily    . FLUoxetine (PROZAC) 20 MG capsule Take 60 mg by mouth daily.    Marland Kitchen ibuprofen (ADVIL,MOTRIN) 600 MG tablet Take 1 tablet (600 mg total) by mouth every 6 (six) hours as needed. (Patient not taking: Reported on 12/19/2015) 30 tablet 0  . oxyCODONE-acetaminophen (PERCOCET/ROXICET) 5-325 MG per tablet Take 1 tablet by mouth every 6 (six) hours as needed (for pain scale 4-7). (Patient not taking: Reported on 12/19/2015) 30 tablet 0  . pyridOXINE (VITAMIN B-6) 100 MG tablet Take 100 mg by mouth daily.    . traZODone (DESYREL) 50 MG tablet Take 50 mg by mouth at bedtime as needed for  sleep.     Marland Kitchen triamcinolone cream (KENALOG) 0.1 % Apply 1 application topically every 3 (three) hours.    . vitamin B-12 (CYANOCOBALAMIN) 250 MCG tablet Take 250 mcg by mouth daily.     No current facility-administered medications on file prior to visit.        Objective:   Physical Exam  Blood pressure (!) 142/90, pulse 64, temperature 98 F (36.7 C), height 5\' 7"  (1.702 m), weight 183 lb 12.8 oz (83.4 kg), unknown if currently breastfeeding.'  Alert and oriented. Skin warm and dry. Oral mucosa is moist.   . Sclera anicteric, conjunctivae is pink. Thyroid not enlarged. No cervical lymphadenopathy. Lungs clear. Heart regular rate and rhythm.  Abdomen is soft. Bowel sounds are positive. No hepatomegaly. No abdominal masses felt. No tenderness.  No edema to lower extremities.  Raised rash to both legs.        Assessment & Plan:  Hepatitis B. She is doing well.  Needs to check with insurance company concerning her Entecavir. She will let me know about the insurance.  Will get a CBC, Hepatic function today.  OV in 6 months.

## 2016-05-16 LAB — CBC WITH DIFFERENTIAL/PLATELET
BASOS ABS: 84 {cells}/uL (ref 0–200)
BASOS PCT: 4 %
EOS PCT: 1 %
Eosinophils Absolute: 21 cells/uL (ref 15–500)
HCT: 39.1 % (ref 35.0–45.0)
HEMOGLOBIN: 12.8 g/dL (ref 11.7–15.5)
LYMPHS ABS: 861 {cells}/uL (ref 850–3900)
Lymphocytes Relative: 41 %
MCH: 33.2 pg — ABNORMAL HIGH (ref 27.0–33.0)
MCHC: 32.7 g/dL (ref 32.0–36.0)
MCV: 101.3 fL — ABNORMAL HIGH (ref 80.0–100.0)
MONOS PCT: 19 %
MPV: 9 fL (ref 7.5–12.5)
Monocytes Absolute: 399 cells/uL (ref 200–950)
NEUTROS ABS: 735 {cells}/uL — AB (ref 1500–7800)
Neutrophils Relative %: 35 %
PLATELETS: 218 10*3/uL (ref 140–400)
RBC: 3.86 MIL/uL (ref 3.80–5.10)
RDW: 12.9 % (ref 11.0–15.0)
WBC: 2.1 10*3/uL — AB (ref 3.8–10.8)

## 2016-05-21 NOTE — Telephone Encounter (Signed)
I will reach out to the company to see if there is financial help for the patient.

## 2016-05-23 NOTE — Telephone Encounter (Signed)
Called [employer], they have sent an application for the patient to complete her part and then there will be a page that we will need to complete. Patient has been called and made aware, she states that she will come by the office to pick up the paper work,complete it and bring it back to [place].

## 2016-05-24 DIAGNOSIS — F341 Dysthymic disorder: Secondary | ICD-10-CM | POA: Diagnosis not present

## 2016-06-07 DIAGNOSIS — F341 Dysthymic disorder: Secondary | ICD-10-CM | POA: Diagnosis not present

## 2016-06-18 DIAGNOSIS — Z5181 Encounter for therapeutic drug level monitoring: Secondary | ICD-10-CM | POA: Diagnosis not present

## 2016-06-18 DIAGNOSIS — Z79899 Other long term (current) drug therapy: Secondary | ICD-10-CM | POA: Diagnosis not present

## 2016-06-18 DIAGNOSIS — L409 Psoriasis, unspecified: Secondary | ICD-10-CM | POA: Diagnosis not present

## 2016-06-18 DIAGNOSIS — L810 Postinflammatory hyperpigmentation: Secondary | ICD-10-CM | POA: Diagnosis not present

## 2016-06-20 ENCOUNTER — Telehealth (INDEPENDENT_AMBULATORY_CARE_PROVIDER_SITE_OTHER): Payer: Self-pay | Admitting: Internal Medicine

## 2016-06-20 NOTE — Telephone Encounter (Signed)
error 

## 2016-06-21 DIAGNOSIS — F341 Dysthymic disorder: Secondary | ICD-10-CM | POA: Diagnosis not present

## 2016-07-12 DIAGNOSIS — F341 Dysthymic disorder: Secondary | ICD-10-CM | POA: Diagnosis not present

## 2016-07-26 DIAGNOSIS — F341 Dysthymic disorder: Secondary | ICD-10-CM | POA: Diagnosis not present

## 2016-08-23 DIAGNOSIS — F341 Dysthymic disorder: Secondary | ICD-10-CM | POA: Diagnosis not present

## 2016-11-12 ENCOUNTER — Ambulatory Visit (INDEPENDENT_AMBULATORY_CARE_PROVIDER_SITE_OTHER): Payer: Medicare Other | Admitting: Internal Medicine

## 2016-11-12 DIAGNOSIS — F341 Dysthymic disorder: Secondary | ICD-10-CM | POA: Diagnosis not present

## 2016-11-28 ENCOUNTER — Ambulatory Visit (INDEPENDENT_AMBULATORY_CARE_PROVIDER_SITE_OTHER): Payer: Medicare Other | Admitting: Internal Medicine

## 2016-11-28 ENCOUNTER — Encounter (INDEPENDENT_AMBULATORY_CARE_PROVIDER_SITE_OTHER): Payer: Self-pay | Admitting: Internal Medicine

## 2016-11-28 VITALS — BP 150/90 | HR 70 | Temp 98.3°F | Ht 66.0 in | Wt 179.2 lb

## 2016-11-28 DIAGNOSIS — B1910 Unspecified viral hepatitis B without hepatic coma: Secondary | ICD-10-CM

## 2016-11-28 NOTE — Progress Notes (Signed)
   Subjective:    Patient ID: [name], female    DOB: 09-11-1980, 36 y.o.   MRN: [id number]  HPI  Here today for f/u. She was last seen in February of 2018.  She was diagnosed with Hepatitis B about 10 yrs ago. She was started on Entecavir 0.5mg  in October of 2017 but stopped due to the expense. Out of pocket was $370.00.  She had a liver biopsy in 2017 which revealed benign liver showing mild chronic hepatitis b, Batts-Ludwig inflammatory Grade 2, Fibrosis 1. Background steatohepatitis. She tells me she is doing good. She is back on the [place]. Started about 2 months ago. No abdominal pain . Her appetite is good. No weight loss. Usually has a BM daily.  04/17/2016 Hep B DNA less than 20. Hepatitis B DNA Calc less than 1.30.   Review of Systems Past Medical History:  Diagnosis Date  . Depression   . Heart murmur   . Hepatitis B    carrier  . Psoriasis     Past Surgical History:  Procedure Laterality Date  . NO PAST SURGERIES    . WISDOM TOOTH EXTRACTION      No Known Allergies  Current Outpatient Prescriptions on File Prior to Visit  Medication Sig Dispense Refill  . entecavir (BARACLUDE) 0.5 MG tablet Take 1 tablet (0.5 mg total) by mouth daily. (Patient not taking: Reported on 05/15/2016) 30 tablet 11  . FLUoxetine (PROZAC) 20 MG capsule Take 60 mg by mouth daily.    . traZODone (DESYREL) 50 MG tablet Take 50 mg by mouth at bedtime as needed for sleep.     Marland Kitchen triamcinolone cream (KENALOG) 0.1 % Apply 1 application topically every 3 (three) hours.     No current facility-administered medications on file prior to visit.         Objective:   Physical Exam   Blood pressure (!) 150/90, pulse 70, temperature 98.3 F (36.8 C), height 5\' 6"  (1.676 m), weight 179 lb 3.2 oz (81.3 kg), unknown if currently breastfeeding.  Alert and oriented. No jaundice. Skin warm and dry. Sclera anicteric. Lungs clear. HR regular. No murmur heard.    No hepatomegaly, No splenomegaly.  Abdomen soft, BS+. No tenderness. No masses felt. Moves all extremities.  Ambulates without difficulty. No edema to lower extremities       Assessment & Plan:  Hepatitis B with very low viral load. Has restarted the [facility] about 2 months ago. Will get a CBC, Hepatic and Hep B quaint PCR OV in 6 months.

## 2016-11-28 NOTE — Patient Instructions (Signed)
OV in 6 months. 

## 2016-12-01 LAB — CBC WITH DIFFERENTIAL/PLATELET
Basophils Absolute: 60 cells/uL (ref 0–200)
Basophils Relative: 2.5 %
EOS PCT: 1.6 %
Eosinophils Absolute: 38 cells/uL (ref 15–500)
HCT: 41 % (ref 35.0–45.0)
Hemoglobin: 14.1 g/dL (ref 11.7–15.5)
Lymphs Abs: 708 cells/uL — ABNORMAL LOW (ref 850–3900)
MCH: 34.1 pg — ABNORMAL HIGH (ref 27.0–33.0)
MCHC: 34.4 g/dL (ref 32.0–36.0)
MCV: 99 fL (ref 80.0–100.0)
MPV: 10.1 fL (ref 7.5–12.5)
Monocytes Relative: 25.4 %
NEUTROS PCT: 41 %
Neutro Abs: 984 cells/uL — ABNORMAL LOW (ref 1500–7800)
PLATELETS: 220 10*3/uL (ref 140–400)
RBC: 4.14 10*6/uL (ref 3.80–5.10)
RDW: 11.6 % (ref 11.0–15.0)
TOTAL LYMPHOCYTE: 29.5 %
WBC mixed population: 610 cells/uL (ref 200–950)
WBC: 2.4 10*3/uL — AB (ref 3.8–10.8)

## 2016-12-01 LAB — HEPATIC FUNCTION PANEL
AG Ratio: 1.2 (calc) (ref 1.0–2.5)
ALT: 21 U/L (ref 6–29)
AST: 46 U/L — AB (ref 10–30)
Albumin: 4.2 g/dL (ref 3.6–5.1)
Alkaline phosphatase (APISO): 53 U/L (ref 33–115)
BILIRUBIN DIRECT: 0.2 mg/dL (ref 0.0–0.2)
BILIRUBIN INDIRECT: 0.6 mg/dL (ref 0.2–1.2)
GLOBULIN: 3.4 g/dL (ref 1.9–3.7)
Total Bilirubin: 0.8 mg/dL (ref 0.2–1.2)
Total Protein: 7.6 g/dL (ref 6.1–8.1)

## 2016-12-01 LAB — HEPATITIS B DNA, ULTRAQUANTITATIVE, PCR
Hepatitis B DNA (Calc): <1 Log IU/mL — ABNORMAL HIGH
Hepatitis B DNA: <10 IU/mL — ABNORMAL HIGH

## 2016-12-03 ENCOUNTER — Other Ambulatory Visit (INDEPENDENT_AMBULATORY_CARE_PROVIDER_SITE_OTHER): Payer: Self-pay | Admitting: *Deleted

## 2016-12-03 DIAGNOSIS — B160 Acute hepatitis B with delta-agent with hepatic coma: Secondary | ICD-10-CM

## 2016-12-12 ENCOUNTER — Telehealth (INDEPENDENT_AMBULATORY_CARE_PROVIDER_SITE_OTHER): Payer: Self-pay | Admitting: *Deleted

## 2016-12-12 NOTE — Telephone Encounter (Signed)
Patient was called and made aware. 

## 2016-12-12 NOTE — Telephone Encounter (Signed)
Let her know I know nothing of this

## 2016-12-12 NOTE — Telephone Encounter (Signed)
Patient left a telephone message stating that she was to have lab work but had not rec'd  A letter yet. This was reviewed and the patient is not to have lab work per Terri until 12/31/2016. Patient called and I explained this to her. She says that she had a phone call from a Dr.Henry whom she has not seem in a while requesting that she come in for appointment regarding results they had rec'd from [place]?  Patient states that she saw Terri a few weeks ago and she is back on [place].  She would appreciate a call back from Terri to see if she may possibly know what is going on.  Call back Number is [phone]

## 2016-12-12 NOTE — Telephone Encounter (Signed)
I do not know anything of this.

## 2016-12-20 ENCOUNTER — Encounter (INDEPENDENT_AMBULATORY_CARE_PROVIDER_SITE_OTHER): Payer: Self-pay | Admitting: *Deleted

## 2016-12-20 ENCOUNTER — Other Ambulatory Visit (INDEPENDENT_AMBULATORY_CARE_PROVIDER_SITE_OTHER): Payer: Self-pay | Admitting: *Deleted

## 2016-12-20 DIAGNOSIS — B160 Acute hepatitis B with delta-agent with hepatic coma: Secondary | ICD-10-CM

## 2016-12-31 DIAGNOSIS — B160 Acute hepatitis B with delta-agent with hepatic coma: Secondary | ICD-10-CM | POA: Diagnosis not present

## 2016-12-31 LAB — CBC
HCT: 44.8 % (ref 35.0–45.0)
HEMOGLOBIN: 15.4 g/dL (ref 11.7–15.5)
MCH: 33.9 pg — AB (ref 27.0–33.0)
MCHC: 34.4 g/dL (ref 32.0–36.0)
MCV: 98.7 fL (ref 80.0–100.0)
MPV: 9.8 fL (ref 7.5–12.5)
Platelets: 283 10*3/uL (ref 140–400)
RBC: 4.54 10*6/uL (ref 3.80–5.10)
RDW: 12.1 % (ref 11.0–15.0)
WBC: 2.5 10*3/uL — ABNORMAL LOW (ref 3.8–10.8)

## 2016-12-31 LAB — HEPATIC FUNCTION PANEL
AG RATIO: 1.3 (calc) (ref 1.0–2.5)
ALKALINE PHOSPHATASE (APISO): 44 U/L (ref 33–115)
ALT: 28 U/L (ref 6–29)
AST: 47 U/L — ABNORMAL HIGH (ref 10–30)
Albumin: 4.3 g/dL (ref 3.6–5.1)
BILIRUBIN DIRECT: 0.1 mg/dL (ref 0.0–0.2)
BILIRUBIN INDIRECT: 0.3 mg/dL (ref 0.2–1.2)
BILIRUBIN TOTAL: 0.4 mg/dL (ref 0.2–1.2)
Globulin: 3.4 g/dL (calc) (ref 1.9–3.7)
Total Protein: 7.7 g/dL (ref 6.1–8.1)

## 2017-01-14 DIAGNOSIS — F341 Dysthymic disorder: Secondary | ICD-10-CM | POA: Diagnosis not present

## 2017-01-28 DIAGNOSIS — F341 Dysthymic disorder: Secondary | ICD-10-CM | POA: Diagnosis not present

## 2017-02-02 ENCOUNTER — Other Ambulatory Visit (INDEPENDENT_AMBULATORY_CARE_PROVIDER_SITE_OTHER): Payer: Self-pay | Admitting: Internal Medicine

## 2017-02-05 DIAGNOSIS — F341 Dysthymic disorder: Secondary | ICD-10-CM | POA: Diagnosis not present

## 2017-02-06 DIAGNOSIS — F341 Dysthymic disorder: Secondary | ICD-10-CM | POA: Diagnosis not present

## 2017-04-09 DIAGNOSIS — F341 Dysthymic disorder: Secondary | ICD-10-CM | POA: Diagnosis not present

## 2017-05-28 ENCOUNTER — Ambulatory Visit (INDEPENDENT_AMBULATORY_CARE_PROVIDER_SITE_OTHER): Payer: Medicare Other | Admitting: Internal Medicine

## 2017-05-28 ENCOUNTER — Encounter (INDEPENDENT_AMBULATORY_CARE_PROVIDER_SITE_OTHER): Payer: Self-pay | Admitting: Internal Medicine

## 2017-06-10 ENCOUNTER — Telehealth (INDEPENDENT_AMBULATORY_CARE_PROVIDER_SITE_OTHER): Payer: Self-pay | Admitting: Internal Medicine

## 2017-06-10 NOTE — Telephone Encounter (Signed)
Needs OV in 2 months

## 2017-08-20 ENCOUNTER — Ambulatory Visit (INDEPENDENT_AMBULATORY_CARE_PROVIDER_SITE_OTHER): Payer: Medicare Other | Admitting: Internal Medicine

## 2017-08-20 ENCOUNTER — Encounter (INDEPENDENT_AMBULATORY_CARE_PROVIDER_SITE_OTHER): Payer: Self-pay | Admitting: Internal Medicine

## 2017-08-20 VITALS — BP 140/80 | HR 76 | Temp 98.4°F | Ht 66.0 in | Wt 175.0 lb

## 2017-08-20 DIAGNOSIS — B181 Chronic viral hepatitis B without delta-agent: Secondary | ICD-10-CM

## 2017-08-20 NOTE — Progress Notes (Signed)
Subjective:    Patient ID: [name], female    DOB: 07/08/1980, 37 y.o.   MRN: [id number]  HPI Here today for f/u. Last seen in September of 2018. Hx of chronic Hepatitis B. Diagnosed with Hep B greater than 10 yrs ago.  She is maintained on B[place]. She tells me she is doing good. Appetite not good.  She says when she becomes depressed she stops eating.  BMs are okay. No melena or BRRB.     11/28/2016 Hep B DNA Quaint:  Less than 10.  04/17/2016 Hep B DNA less than 20. Hepatitis B DNA Calc less than 1.30.      She had a liver biopsy in 2017 which revealed benign liver showing mild chronic hepatitis b, Batts-Ludwig inflammatory Grade 2, Fibrosis 1. Background steatohepatitis.  Per records her HepBSAg is positive, Hepatitis B surface antibody negative, He B core Ab IgM negative, Hep BE ab Positive, Hep B core Ab total and Hep Be Ag negative..      CBC    Component Value Date/Time   WBC 2.5 (L) 12/31/2016 1029   RBC 4.54 12/31/2016 1029   HGB 15.4 12/31/2016 1029   HCT 44.8 12/31/2016 1029   PLT 283 12/31/2016 1029   MCV 98.7 12/31/2016 1029   MCH 33.9 (H) 12/31/2016 1029   MCHC 34.4 12/31/2016 1029   RDW 12.1 12/31/2016 1029   LYMPHSABS 708 (L) 11/28/2016 1142   MONOABS 399 05/15/2016 1042   EOSABS 38 11/28/2016 1142   BASOSABS 60 11/28/2016 1142   Hepatic Function Latest Ref Rng & Units 12/31/2016 11/28/2016 05/15/2016  Total Protein 6.1 - 8.1 g/dL 7.7 7.6 7.1  Albumin 3.6 - 5.1 g/dL - - 3.7  AST 10 - 30 U/L 47(H) 46(H) 97(H)  ALT 6 - 29 U/L 28 21 31(H)  Alk Phosphatase 33 - 115 U/L - - 59  Total Bilirubin 0.2 - 1.2 mg/dL 0.4 0.8 0.3  Bilirubin, Direct 0.0 - 0.2 mg/dL 0.1 0.2 0.1       Review of Systems Past Medical History:  Diagnosis Date  . Depression   . Heart murmur   . Hepatitis B    carrier  . Psoriasis     Past Surgical History:  Procedure Laterality Date  . NO PAST SURGERIES    . WISDOM TOOTH EXTRACTION      No Known Allergies  Current  Outpatient Medications on File Prior to Visit  Medication Sig Dispense Refill  . citalopram (CELEXA) 20 MG tablet Take 20 mg by mouth daily.    Marland Kitchen entecavir (BARACLUDE) 0.5 MG tablet TAKE ONE TABLET BY MOUTH ONCE DAILY 30 tablet 11  . Secukinumab (COSENTYX [place]) Inject into the skin every 30 (thirty) days.    . traZODone (DESYREL) 50 MG tablet Take 50 mg by mouth at bedtime as needed for sleep.     Marland Kitchen triamcinolone cream (KENALOG) 0.1 % Apply 1 application topically as needed.      No current facility-administered medications on file prior to visit.         Objective:   Physical Exam Blood pressure 140/80, pulse 76, temperature 98.4 F (36.9 C), height 5' 6"  (1.676 m), weight 175 lb (79.4 kg), unknown if currently breastfeeding. Alert and oriented. Skin warm and dry. Oral mucosa is moist.   . Sclera anicteric, conjunctivae is pink. Thyroid not enlarged. No cervical lymphadenopathy. Lungs clear. Heart regular rate and rhythm.  Abdomen is soft. Bowel sounds are positive. No hepatomegaly. No abdominal  masses felt. No tenderness.  No edema to lower extremities.           Assessment & Plan:  Hepatiits B with low viral load. She will continue the [place].  OV in 1 year.

## 2017-08-20 NOTE — Patient Instructions (Signed)
Labs today. OV in 1 y ear.  

## 2017-08-21 DIAGNOSIS — B181 Chronic viral hepatitis B without delta-agent: Secondary | ICD-10-CM | POA: Diagnosis not present

## 2017-08-22 ENCOUNTER — Telehealth (INDEPENDENT_AMBULATORY_CARE_PROVIDER_SITE_OTHER): Payer: Self-pay | Admitting: Internal Medicine

## 2017-08-22 NOTE — Telephone Encounter (Signed)
I have talked with patient  

## 2017-08-22 NOTE — Telephone Encounter (Signed)
Patient returned your call.

## 2017-08-23 LAB — HEPATIC FUNCTION PANEL
AG RATIO: 1.3 (calc) (ref 1.0–2.5)
ALKALINE PHOSPHATASE (APISO): 37 U/L (ref 33–115)
ALT: 58 U/L — ABNORMAL HIGH (ref 6–29)
AST: 132 U/L — ABNORMAL HIGH (ref 10–30)
Albumin: 3.9 g/dL (ref 3.6–5.1)
BILIRUBIN INDIRECT: 0.2 mg/dL (ref 0.2–1.2)
BILIRUBIN TOTAL: 0.3 mg/dL (ref 0.2–1.2)
Bilirubin, Direct: 0.1 mg/dL (ref 0.0–0.2)
Globulin: 3 g/dL (calc) (ref 1.9–3.7)
Total Protein: 6.9 g/dL (ref 6.1–8.1)

## 2017-08-23 LAB — CBC WITH DIFFERENTIAL/PLATELET
BASOS ABS: 70 {cells}/uL (ref 0–200)
Basophils Relative: 2.5 %
EOS ABS: 90 {cells}/uL (ref 15–500)
Eosinophils Relative: 3.2 %
HEMATOCRIT: 33.7 % — AB (ref 35.0–45.0)
HEMOGLOBIN: 11.8 g/dL (ref 11.7–15.5)
LYMPHS ABS: 1260 {cells}/uL (ref 850–3900)
MCH: 33.5 pg — AB (ref 27.0–33.0)
MCHC: 35 g/dL (ref 32.0–36.0)
MCV: 95.7 fL (ref 80.0–100.0)
MPV: 9.8 fL (ref 7.5–12.5)
Monocytes Relative: 21.1 %
NEUTROS PCT: 28.2 %
Neutro Abs: 790 cells/uL — ABNORMAL LOW (ref 1500–7800)
Platelets: 276 10*3/uL (ref 140–400)
RBC: 3.52 10*6/uL — ABNORMAL LOW (ref 3.80–5.10)
RDW: 13 % (ref 11.0–15.0)
Total Lymphocyte: 45 %
WBC mixed population: 591 cells/uL (ref 200–950)
WBC: 2.8 10*3/uL — ABNORMAL LOW (ref 3.8–10.8)

## 2017-08-23 LAB — HEPATITIS B DNA, ULTRAQUANTITATIVE, PCR
Hepatitis B DNA (Calc): 1.45 Log IU/mL — ABNORMAL HIGH
Hepatitis B DNA: 29 IU/mL — ABNORMAL HIGH

## 2017-08-26 ENCOUNTER — Other Ambulatory Visit (INDEPENDENT_AMBULATORY_CARE_PROVIDER_SITE_OTHER): Payer: Self-pay | Admitting: *Deleted

## 2017-08-26 DIAGNOSIS — B181 Chronic viral hepatitis B without delta-agent: Secondary | ICD-10-CM

## 2017-08-26 DIAGNOSIS — L409 Psoriasis, unspecified: Secondary | ICD-10-CM | POA: Diagnosis not present

## 2017-08-26 DIAGNOSIS — Z5181 Encounter for therapeutic drug level monitoring: Secondary | ICD-10-CM | POA: Diagnosis not present

## 2017-08-26 DIAGNOSIS — Z79899 Other long term (current) drug therapy: Secondary | ICD-10-CM | POA: Diagnosis not present

## 2017-09-16 ENCOUNTER — Encounter (INDEPENDENT_AMBULATORY_CARE_PROVIDER_SITE_OTHER): Payer: Self-pay | Admitting: *Deleted

## 2017-09-16 ENCOUNTER — Other Ambulatory Visit (INDEPENDENT_AMBULATORY_CARE_PROVIDER_SITE_OTHER): Payer: Self-pay | Admitting: *Deleted

## 2017-09-16 DIAGNOSIS — B181 Chronic viral hepatitis B without delta-agent: Secondary | ICD-10-CM

## 2017-10-16 DIAGNOSIS — Z113 Encounter for screening for infections with a predominantly sexual mode of transmission: Secondary | ICD-10-CM | POA: Diagnosis not present

## 2017-10-16 DIAGNOSIS — Z124 Encounter for screening for malignant neoplasm of cervix: Secondary | ICD-10-CM | POA: Diagnosis not present

## 2017-10-17 DIAGNOSIS — Z124 Encounter for screening for malignant neoplasm of cervix: Secondary | ICD-10-CM | POA: Diagnosis not present

## 2017-10-17 DIAGNOSIS — B181 Chronic viral hepatitis B without delta-agent: Secondary | ICD-10-CM | POA: Diagnosis not present

## 2017-10-19 LAB — HEPATITIS B DNA, ULTRAQUANTITATIVE, PCR: Hepatitis B DNA (Calc): <1 Log IU/mL — ABNORMAL HIGH

## 2018-01-10 DIAGNOSIS — F341 Dysthymic disorder: Secondary | ICD-10-CM | POA: Diagnosis not present

## 2018-01-23 DIAGNOSIS — F341 Dysthymic disorder: Secondary | ICD-10-CM | POA: Diagnosis not present

## 2018-03-12 DIAGNOSIS — F341 Dysthymic disorder: Secondary | ICD-10-CM | POA: Diagnosis not present

## 2018-03-25 DIAGNOSIS — F411 Generalized anxiety disorder: Secondary | ICD-10-CM | POA: Diagnosis not present

## 2018-03-25 DIAGNOSIS — F341 Dysthymic disorder: Secondary | ICD-10-CM | POA: Diagnosis not present

## 2018-03-31 DIAGNOSIS — Z3041 Encounter for surveillance of contraceptive pills: Secondary | ICD-10-CM | POA: Diagnosis not present

## 2018-03-31 DIAGNOSIS — Z3202 Encounter for pregnancy test, result negative: Secondary | ICD-10-CM | POA: Diagnosis not present

## 2018-04-22 DIAGNOSIS — F411 Generalized anxiety disorder: Secondary | ICD-10-CM | POA: Diagnosis not present

## 2018-04-22 DIAGNOSIS — F341 Dysthymic disorder: Secondary | ICD-10-CM | POA: Diagnosis not present

## 2018-05-15 IMAGING — US US BIOPSY
1 series · 13 of 24 positions shown · non-contrast
Comparison: Abdominal ultrasound - 12/09/2015;

INDICATION: History of hepatitis B, now with elevated LFTs. Please perform
ultrasound-guided liver biopsy for tissue diagnostic purposes.

EXAM:
ULTRASOUND GUIDED LIVER BIOPSY

[Series 1: us biopsy · 0.26mm/px · 13 of 24 slices shown]
[im 1/24]
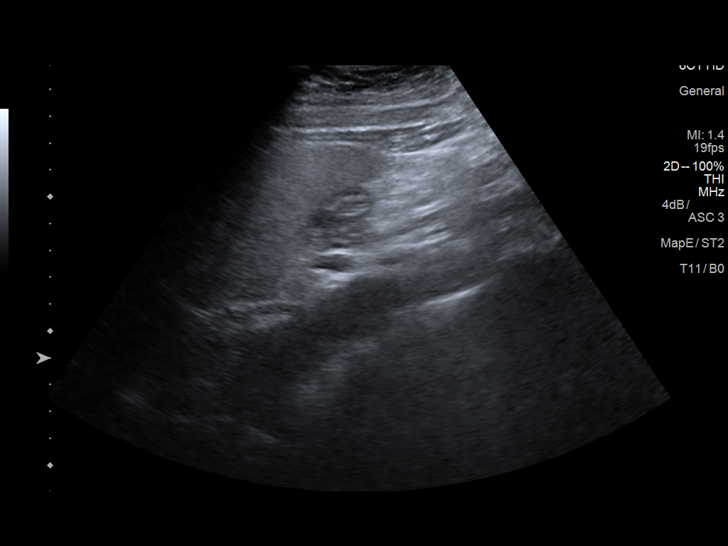
[im 3/24]
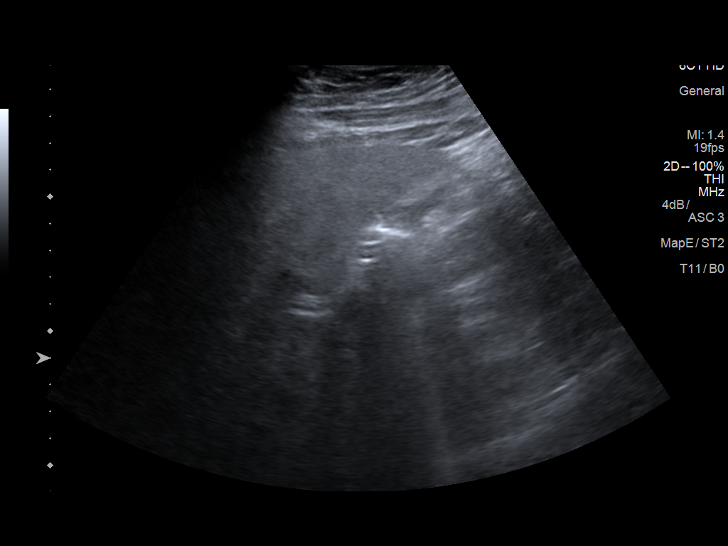
[im 5/24]
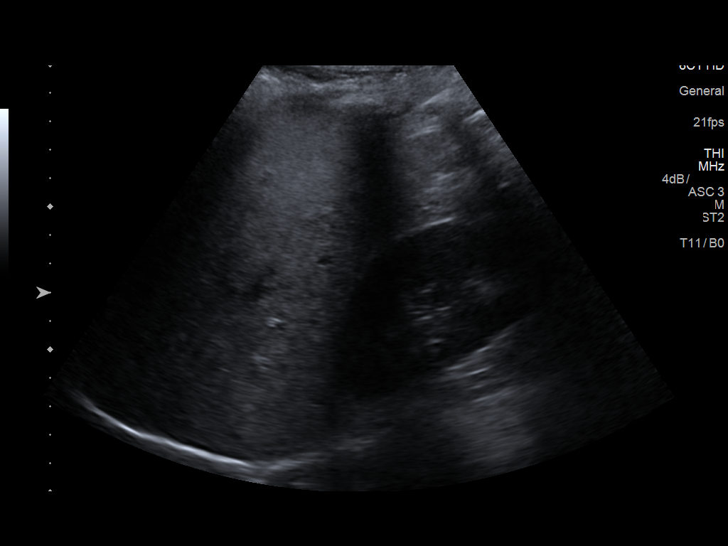
[im 7/24]
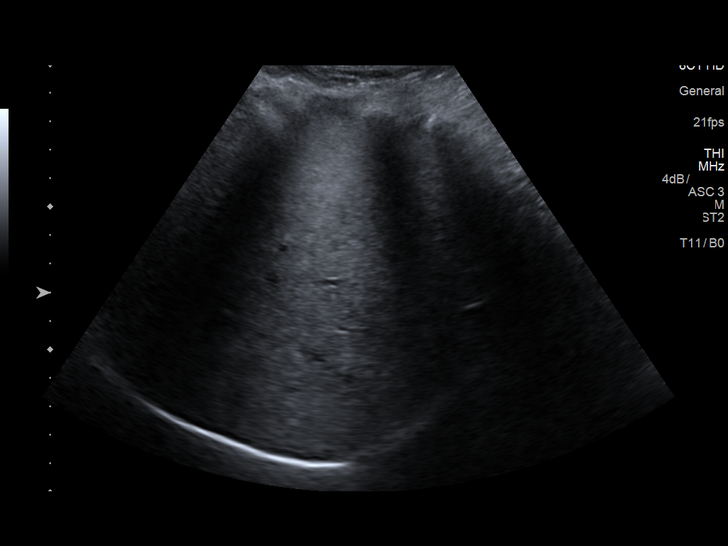
[im 9/24]
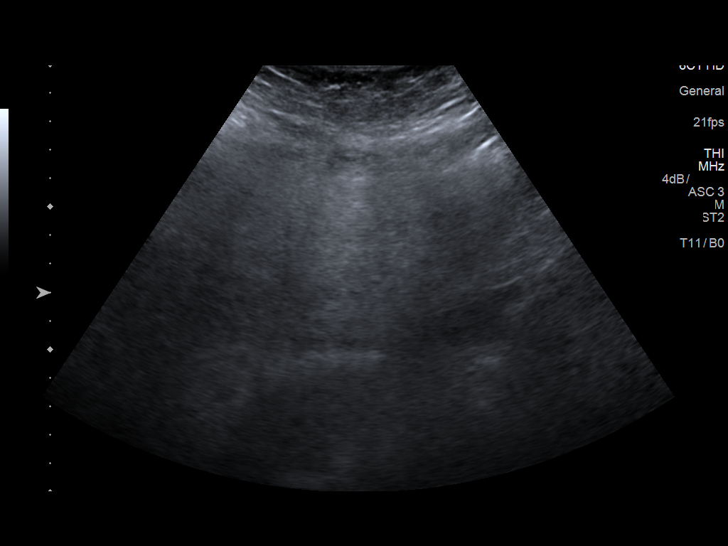
[im 11/24]
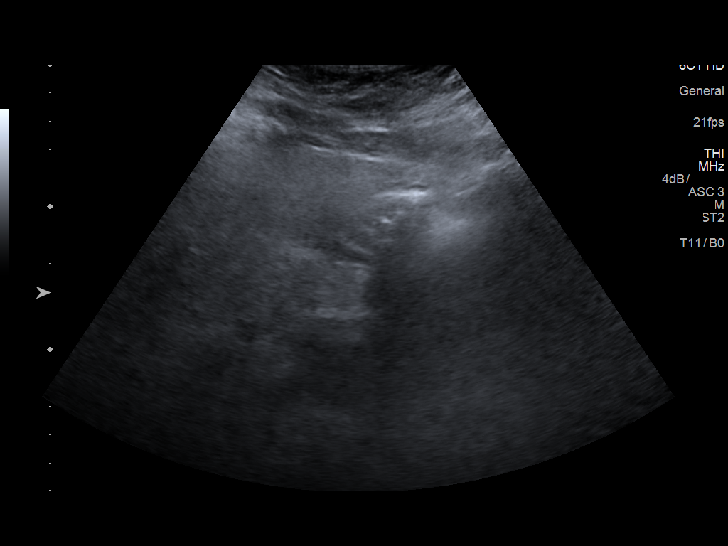
[im 13/24]
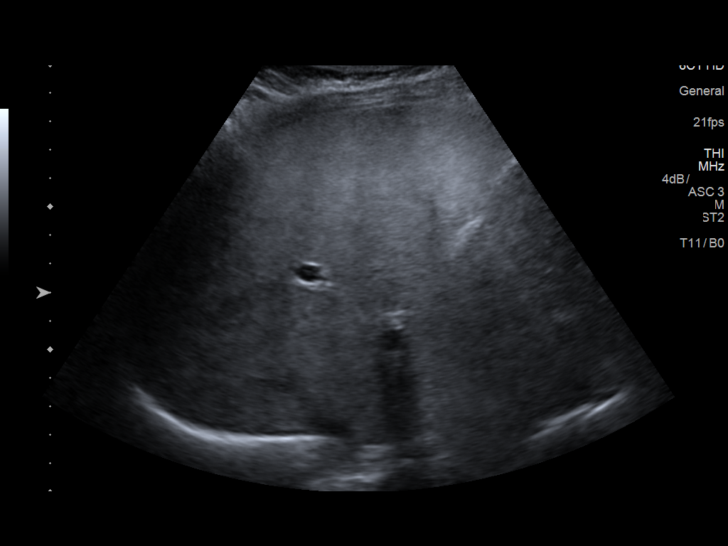
[im 14/24]
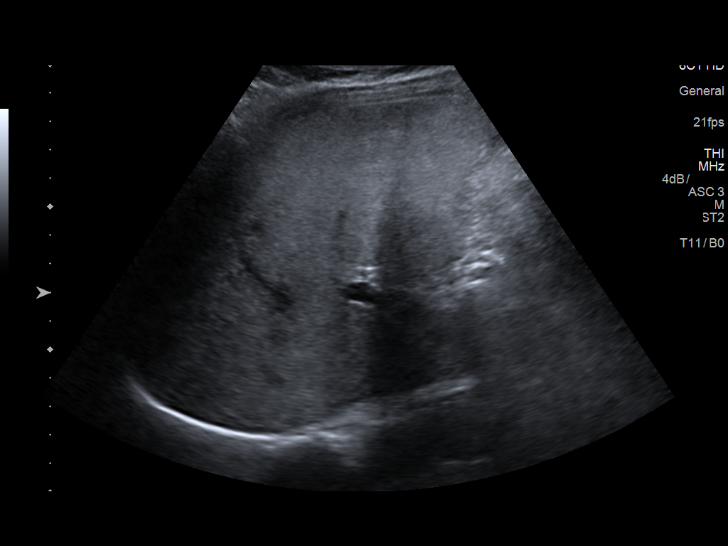
[im 16/24]
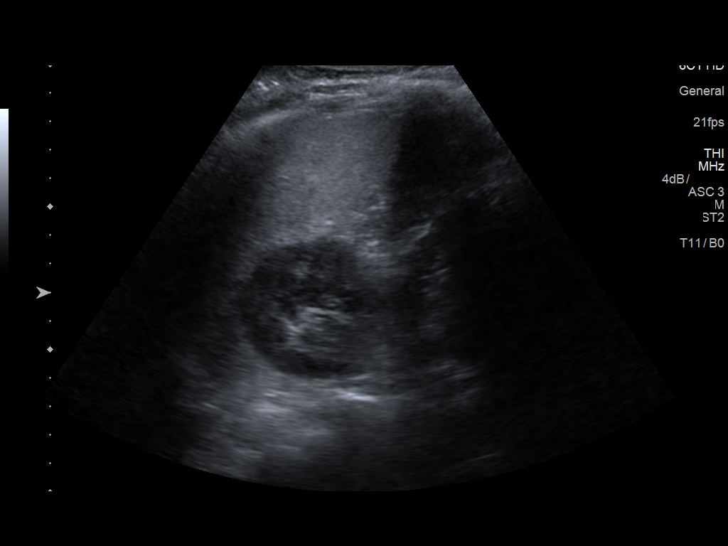
[im 18/24]
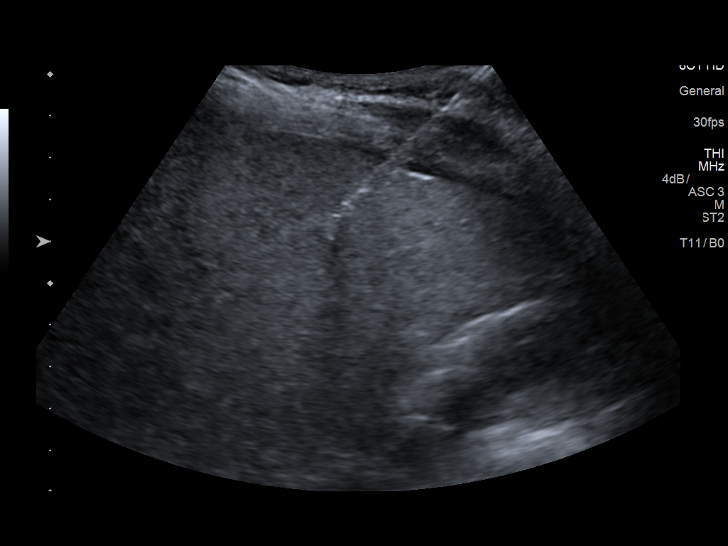
[im 20/24]
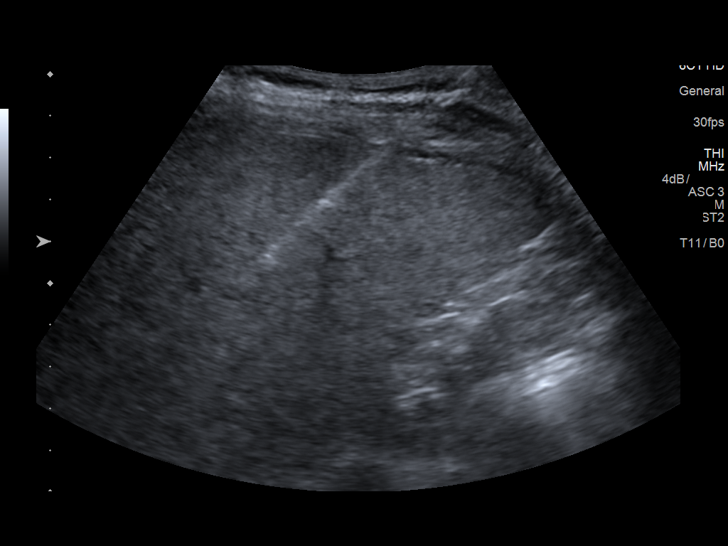
[im 22/24]
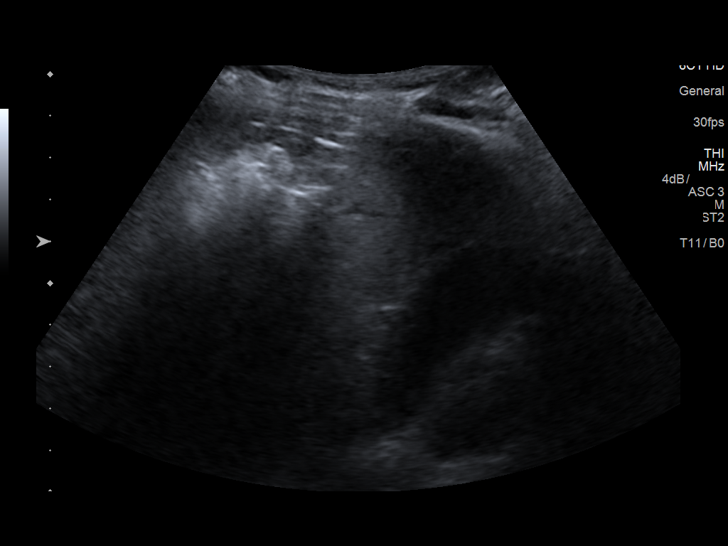
[im 24/24]
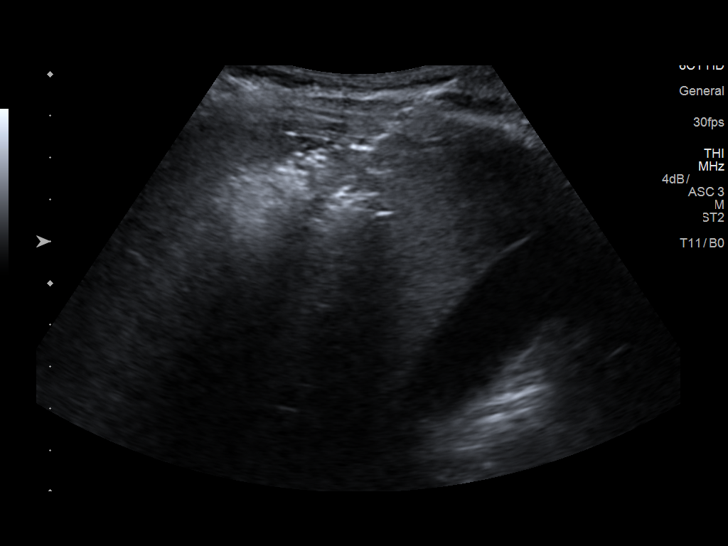

[13 of 24 positions shown; findings below may reference images not displayed]

CT abdomen pelvis -
06/29/2011

MEDICATIONS:
None

ANESTHESIA/SEDATION:
Fentanyl 75 mcg IV; Versed 1.5 mg IV

Total Moderate Sedation time: 10 minutes; The patient was
continuously monitored during the procedure by the interventional
radiology nurse under my direct supervision.

COMPLICATIONS:
None immediate.

PROCEDURE:
Informed written consent was obtained from the patient after a
discussion of the risks, benefits and alternatives to treatment. The
patient understands and consents the procedure. A timeout was
performed prior to the initiation of the procedure.

Ultrasound scanning was performed of the right upper abdominal
quadrant and the procedure was planned. The right upper abdomen was
prepped and draped in the usual sterile fashion. The overlying soft
tissues were anesthetized with 1% lidocaine with epinephrine. A 17
gauge, 6.8 cm co-axial needle was advanced into a peripheral aspect
of the right lobe of the liver and 3 core biopsies were obtained
with an 18 gauge core device under direct ultrasound guidance.

The co-axial needle track was embolized with the administration of a
Gel-Foam slurry. Superficial hemostasis was obtained with manual
compression. Post procedural scanning was negative for definitive
area of hemorrhage. A dressing was placed. The patient tolerated the
procedure well without immediate post procedural complication.
IMPRESSION: Technically successful ultrasound guided liver biopsy.

## 2018-11-19 ENCOUNTER — Other Ambulatory Visit: Payer: Self-pay

## 2018-11-19 ENCOUNTER — Ambulatory Visit (INDEPENDENT_AMBULATORY_CARE_PROVIDER_SITE_OTHER): Payer: BC Managed Care – PPO | Admitting: Nurse Practitioner

## 2018-11-19 ENCOUNTER — Encounter (INDEPENDENT_AMBULATORY_CARE_PROVIDER_SITE_OTHER): Payer: Self-pay | Admitting: Nurse Practitioner

## 2018-11-19 ENCOUNTER — Encounter (INDEPENDENT_AMBULATORY_CARE_PROVIDER_SITE_OTHER): Payer: Self-pay | Admitting: *Deleted

## 2018-11-19 VITALS — BP 125/86 | HR 87 | Temp 98.2°F | Resp 18 | Ht 66.0 in | Wt 200.8 lb

## 2018-11-19 DIAGNOSIS — R7401 Elevation of levels of liver transaminase levels: Secondary | ICD-10-CM

## 2018-11-19 DIAGNOSIS — R740 Nonspecific elevation of levels of transaminase and lactic acid dehydrogenase [LDH]: Secondary | ICD-10-CM | POA: Diagnosis not present

## 2018-11-19 DIAGNOSIS — D649 Anemia, unspecified: Secondary | ICD-10-CM

## 2018-11-19 DIAGNOSIS — B181 Chronic viral hepatitis B without delta-agent: Secondary | ICD-10-CM

## 2018-11-19 NOTE — Progress Notes (Addendum)
Subjective:    Patient ID: [name], female    DOB: Feb 12, 1981, 38 y.o.   MRN: [id number]  Patient's transaminases are normal. As she appears to have had miscarriage will leave out on entecavir and not switch her to tenofovir. Patient is to follow-up with her gynecologist and keep [place] posted  patient is a 38 year old female with a past medical history of depression, psoriasis and chronic hepatitis B on Baraclude 0.5 mg once daily.  She is currently [redacted] weeks gestation, unfortunately her hCG levels have declined with associated breakthrough vaginal bleeding concerning for a tubal ligation or impending miscarriage. She is followed closely by her OB, Dr. Ulanda Edison.  A repeat Hcg level was done earlier today. She had laboratory studies done 11/14/2018 by Dr. Ulanda Edison which showed elevated liver enzymes. She was advised to follow-up in our office for further evaluation. I have requested a copy of these lab results for my review.   This is my first time seeing [employer].  She reports being diagnosed with chronic hepatitis B approximately 15 years ago.  She was started on Baraclude 0.5 mg once daily following her liver biopsy which was done September 2017.  She does not know how she contracted chronic hepatitis B.  No prior history of IV or nasal drug use, no blood transfusions prior to the 1980s and no known sexual partners with chronic hepatitis B.  She is noncompliant with her Baraclude.  She reports taking Baraclude 3 to 4 days weekly, she often forgets to take this medication at least 3 days weekly. She was last seen in the office by Deberah Castle, NP on 08/20/2017. At that time, the patient also reported skipping does of [place]. Labs 08/22/27 Hep B DNA 29 iu/ml. AST 132.  ALT 58. T. Bili 0.3. Alk phos 37. A repeat Hep B DNA 10/17/2017 was undetectable.  She drinks 3 glasses of wine or 4-5 shots of liquor 2 to 3 days weekly which she stopped approximately 3 weeks ago when she found out she was pregnant.   No NSAIDs.  No new medications within the past 6 months.  Her newest medication Trintellix 20 mg once daily was started approximately 1 year ago.  She denies having any nausea or vomiting.  No upper or lower abdominal pain.  No jaundice or pruritus.  Appetite is good.  No weight loss.  No family history of liver disease.  He is passing a soft bowel movement most days, sometimes her stool is darker brown but not black.  No rectal bleeding. She does not know if she has been checked for hepatitis C. I did not find Hep C antibody results in chart. She reported being tested for HIV 4 years ago which was negative.   Liver biopsy 12/20/2015: BENIGN LIVER SHOWING MILD CHRONIC HEPATITIS B, BATTS-LUDWIG INFLAMMATORY GRADE 2, FIBROSIS STAGE 1. - BACKGROUND OF STEATOHEPATITIS.  Abdominal sonogram 12/09/2015:  1. Increased hepatic echotexture consistent with hepatocellular disease, likely fatty infiltration. No hepatic mass or surface contour irregularity is observed. Normal appearing gallbladder and pancreas. 2. Normal size and echotexture of the spleen. 3. 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  Current Outpatient Medications on File Prior to Visit  Medication Sig Dispense Refill   Brexpiprazole (REXULTI) 0.25 MG TABS Take by mouth daily.     entecavir (BARACLUDE) 0.5 MG tablet TAKE ONE TABLET BY MOUTH ONCE DAILY 30 tablet 11   traZODone (DESYREL) 50 MG tablet Take 50 mg by mouth  at bedtime as needed for sleep.      triamcinolone cream (KENALOG) 0.1 % Apply 1 application topically as needed.      vortioxetine HBr (TRINTELLIX) 20 MG TABS tablet Take 20 mg by mouth daily.     Secukinumab (COSENTYX [place]) Inject into the skin every 30 (thirty) days.     No current facility-administered medications on file prior to visit.    No Known Allergies  Review of Systems see HPI, all other systems reviewed and are negative     Objective:   Physical Exam  Blood pressure  125/86, pulse 87, temperature 98.2 F (36.8 C), temperature source Oral, resp. rate 18, height 5' 6"  (1.676 m), weight 200 lb 12.8 oz (91.1 kg), unknown if currently breastfeeding.  General: 38 year old female in no acute distress Eyes: Sclera nonicteric, conjunctiva pink Mouth: Dentition intact, no ulcers or lesions Neck: Supple, no thyromegaly or lymphadenopathy Heart: Regular rate and rhythm, no murmurs Lungs: Breath sounds clear throughout abdomen: Soft, nontender, no masses or organomegaly, positive bowel sounds to all 4 quadrants, striae. Rectal: Deferred Extremities: Trace edema to the left ankle Neuro: Alert and oriented x4, no focal deficits Skin: No obvious jaundice     Assessment & Plan:   15.  38 year old female [redacted] weeks gestation with declining hCG levels presents for chronic hepatitis B follow-up in setting of elevated LFTs -copy of labs done 11/14/2018 by OB requested  -repeat hepatic panel -Hep B DNA quant, Hep B surf ag, Hep B surface antibody, Hep B core IgM,  Hep C antibody,  Hep A antibody total/IgM. Pt/INR. CBC.  HIV level ordered but diagnosis code did not cover so HIV cancelled for now.  -will check Hep E IgM ab if the above lab results show acute transaminitis -abdominal sonogram  -No Alcohol discussed with patient  -Continue Baraclude 0.76m po daily for now, advised patient to place [place] bed side with water, to take upon awakening every morning to avoid missing a dose. I discussed the risk of reactivation of chronic hepatitis B which could result in acute liver failure if BSherren Kernsis stopped abruptly. -await review of the above results before consider liver biopsy  -if not immune to Hep A will need vaccination   2. Hepatic steatosis  -see # 1  Further recommendations per Dr. RLaural Golden  ADDENDUM: LABS 10/20/2018 RECEIVED: AST 15. ALT 14. T.BILI 0.2. ALK PHOS 112.

## 2018-11-19 NOTE — Patient Instructions (Signed)
1. Complete the ordered blood tests  2. Schedule an abdominal ultrasound  3. No alcohol   4. I have requested a copy of your recent labs from your OB's office   5. Follow up in office in 4 weeks

## 2018-11-20 ENCOUNTER — Telehealth (INDEPENDENT_AMBULATORY_CARE_PROVIDER_SITE_OTHER): Payer: Self-pay | Admitting: Nurse Practitioner

## 2018-11-20 NOTE — Telephone Encounter (Signed)
Jan from Dr [facility] office called regarding patient

## 2018-11-24 DIAGNOSIS — N859 Noninflammatory disorder of uterus, unspecified: Secondary | ICD-10-CM | POA: Diagnosis not present

## 2018-11-24 DIAGNOSIS — O0281 Inappropriate change in quantitative human chorionic gonadotropin (hCG) in early pregnancy: Secondary | ICD-10-CM | POA: Diagnosis not present

## 2018-11-24 DIAGNOSIS — O2691 Pregnancy related conditions, unspecified, first trimester: Secondary | ICD-10-CM | POA: Diagnosis not present

## 2018-11-25 ENCOUNTER — Other Ambulatory Visit: Payer: Self-pay

## 2018-11-25 ENCOUNTER — Ambulatory Visit (HOSPITAL_COMMUNITY)
Admission: RE | Admit: 2018-11-25 | Discharge: 2018-11-25 | Disposition: A | Discharge status: Home / Self Care | Payer: BC Managed Care – PPO | Source: Ambulatory Visit | Attending: Nurse Practitioner | Admitting: Nurse Practitioner

## 2018-11-25 ENCOUNTER — Telehealth (INDEPENDENT_AMBULATORY_CARE_PROVIDER_SITE_OTHER): Payer: Self-pay | Admitting: Nurse Practitioner

## 2018-11-25 DIAGNOSIS — R740 Nonspecific elevation of levels of transaminase and lactic acid dehydrogenase [LDH]: Secondary | ICD-10-CM | POA: Diagnosis not present

## 2018-11-25 DIAGNOSIS — B181 Chronic viral hepatitis B without delta-agent: Secondary | ICD-10-CM | POA: Insufficient documentation

## 2018-11-25 DIAGNOSIS — R7402 Elevation of levels of lactic acid dehydrogenase (LDH): Secondary | ICD-10-CM

## 2018-11-25 NOTE — Telephone Encounter (Signed)
Dr. Laural Golden verified MTX dose 50mg /m2 = 112mg  x 1 dose, repeat in 1 week if needed is acceptable for pt. I called Dr. Ulanda Edison with Dr. Olevia Perches response.   I called patient and advised she follow up in our office in 2 months to discuss her chronic hep B treatment, if she intends on future pregnancy, then [facility] would be changed to Viread. She agreed to schedule ov in 2 months.

## 2018-11-25 NOTE — Telephone Encounter (Signed)
Dr Ulanda Edison left message stating he would like to know from Dr Laural Golden about giving patient Methotrexate -  Would like a call back at [phone] or [phone]

## 2018-11-25 NOTE — Telephone Encounter (Signed)
Patient left message for you to call her at [phone]

## 2018-11-25 NOTE — Telephone Encounter (Signed)
I spoke with Dr. Laural Golden, he stated patient may be on Methotrexate for short term if needed for a possible tubal pregnancy. I called Dr. Ulanda Edison and provided this information. Dr. Ulanda Edison then asked if ok for patient to receive approximately 112mg  x 1 and possible repeat dose in 1 weeks if needed. I will further verify this with Dr. Laural Golden. Dr.Henley verified pt does not have a viable pregnancy.

## 2018-11-25 NOTE — Telephone Encounter (Signed)
See phone note today with Dr. Ulanda Edison ob/gyn

## 2018-12-02 ENCOUNTER — Other Ambulatory Visit (INDEPENDENT_AMBULATORY_CARE_PROVIDER_SITE_OTHER): Payer: Self-pay | Admitting: Nurse Practitioner

## 2018-12-02 DIAGNOSIS — D649 Anemia, unspecified: Secondary | ICD-10-CM

## 2018-12-02 DIAGNOSIS — B181 Chronic viral hepatitis B without delta-agent: Secondary | ICD-10-CM

## 2018-12-02 DIAGNOSIS — R7401 Elevation of levels of liver transaminase levels: Secondary | ICD-10-CM

## 2018-12-15 DIAGNOSIS — L810 Postinflammatory hyperpigmentation: Secondary | ICD-10-CM | POA: Diagnosis not present

## 2018-12-15 DIAGNOSIS — L409 Psoriasis, unspecified: Secondary | ICD-10-CM | POA: Diagnosis not present

## 2018-12-15 DIAGNOSIS — Z79899 Other long term (current) drug therapy: Secondary | ICD-10-CM | POA: Diagnosis not present

## 2018-12-20 NOTE — Progress Notes (Addendum)
° °  Subjective:    Patient ID: [name], female    DOB: May 27, 1980, 38 y.o.   MRN: [id number]  HPI [name] is a 38 year old female with a past medical history significant for chronic hepatitis B diagnosed approximately 15 years ago. She was started on Baraclude 0.5 mg once daily following her liver biopsy September 2017.  Liver biopsy revealed grade 2 and stage F1 disease.  Disease she was last seen in office on 11/19/2018 with complains of elevated LFTs in the setting of a failing pregnancy, tubal pregnancy.  I received a copy of her LFTs  10/20/2018 from her OB which were normal (not elevated as the patient previously reported) AST 15. ALT 14. T.BILI 0.2. Alk phos 112.  Repeat hepatic panel and hep B DNA were ordered but  the patient did not complete. I spoke to her OB/GYN, Dr. Ulanda Edison, on 11/25/2018, he considered prescribing Methotrexate for her miscarriage. Her hCG levels diminished and her miscarriage then resolved spontaneously therefore she did not require MTX. She presents today for further follow-up. We discussed changing her chronic hepatitis B treatment from [facility] to Viread ( pregnancy category B) as she intends to conceive again in the near future. A surveillance abdominal sonogram was done 11/25/2018 which showed an echogenic liver, no evidence of a hepatoma. She was seen by her dermatologist Dr. Lyman Speller at [facility] dermatology in [place]. Labs done 12/15/2018 included an acute hepatitis panel that was  negative.  Hepatitis B surface antigen was negative.  Quantiferon  gold was negative.  She was prescribed Cosentyx '300mg'$  injection  to be administered every 30 days. She denies having any upper abdominal pain, nausea or  vomiting. She has menstrual like lower abdominal cramping 2 weeks ago which has abated. She is passing mud like stools once or twice daily. No blood. Stool looked dark, maybe  black on one occasion a few weeks ago. No heartburn or GERD symptoms. She takes [employer]. She has lost 6 or 7 lbs past month.   12/15/2018: Hep B surface ag reactive. Hep B surg ab nonreactive. Hep B core AB reactive. Hep C antibody nonreactive. Hepatitis A IgG and IgM nonreactive.  10/20/2018: AST 15. ALT 14. T. Bili 0.2. Alk phos 112.  10/17/2017: Hep B DNA < 10  11/15/2014:  Hep BE AB: positiveresult obtained from 11/15/2014 OB/gyn consult note by Dr. Newton Pigg.      Objective:   Physical Exam  BP 117/83    Pulse 86    Temp 98.4 F (36.9 C) (Oral)    Ht '5\' 6"'$  (1.676 m)    Wt 195 lb 1.6 oz (88.5 kg)    BMI 31.49 kg/m  Prior weight 200lbs. General: 38 year old female in NAD Heart: RRR, no murmur Lungs: clear throughout Abdomen: soft, nontender, + BS x 4 quads, no HSM Extremities: no edema Neuro: alert and oriented x 4     Assessment & Plan:   30. 38 year old female with chronic hepatitis B, Hep Be antibody positive on Baraclude 0.'5mg'$  po QD with recent failed pregnancy/tubal pregnancy present today for follow up. -Patient will start Viread '300mg'$  once daily which is pregnancy category B as patient intends to conceive in the near future. Stop Baraclude once Viread received.  -CMP, CBC, Hep B DNA quant and AFP -patient needs hepatitis A vaccination  -Follow up in the office in 3 months and as needed

## 2018-12-22 ENCOUNTER — Other Ambulatory Visit: Payer: Self-pay

## 2018-12-22 ENCOUNTER — Ambulatory Visit (INDEPENDENT_AMBULATORY_CARE_PROVIDER_SITE_OTHER): Payer: BC Managed Care – PPO | Admitting: Nurse Practitioner

## 2018-12-22 ENCOUNTER — Encounter (INDEPENDENT_AMBULATORY_CARE_PROVIDER_SITE_OTHER): Payer: Self-pay | Admitting: Nurse Practitioner

## 2018-12-22 VITALS — BP 117/83 | HR 86 | Temp 98.4°F | Ht 66.0 in | Wt 195.1 lb

## 2018-12-22 DIAGNOSIS — B181 Chronic viral hepatitis B without delta-agent: Secondary | ICD-10-CM

## 2018-12-22 MED ORDER — TENOFOVIR DISOPROXIL FUMARATE 300 MG PO TABS: 300.0000 mg | ORAL_TABLET | Freq: Every day | ORAL | 1 refills | Status: AC

## 2018-12-22 NOTE — Patient Instructions (Addendum)
1. Complete the provided lab order today  2. Stop Baraclude once you have Viread (Tenofovir). You will take Viread (Tenofovir) 300mg  one tab by mouth once daily.  3. Follow up in office in 3 months

## 2019-01-02 DIAGNOSIS — B181 Chronic viral hepatitis B without delta-agent: Secondary | ICD-10-CM | POA: Diagnosis not present

## 2019-01-07 LAB — AFP TUMOR MARKER: AFP-Tumor Marker: 4.1 ng/mL

## 2019-01-07 LAB — CBC WITH DIFFERENTIAL/PLATELET
Absolute Monocytes: 558 cells/uL (ref 200–950)
Basophils Absolute: 102 cells/uL (ref 0–200)
Basophils Relative: 3 %
Eosinophils Absolute: 41 cells/uL (ref 15–500)
Eosinophils Relative: 1.2 %
HCT: 41.2 % (ref 35.0–45.0)
Hemoglobin: 14 g/dL (ref 11.7–15.5)
Lymphs Abs: 884 cells/uL (ref 850–3900)
MCH: 34.2 pg — ABNORMAL HIGH (ref 27.0–33.0)
MCHC: 34 g/dL (ref 32.0–36.0)
MCV: 100.7 fL — ABNORMAL HIGH (ref 80.0–100.0)
MPV: 10.5 fL (ref 7.5–12.5)
Monocytes Relative: 16.4 %
Neutro Abs: 1816 cells/uL (ref 1500–7800)
Neutrophils Relative %: 53.4 %
Platelets: 235 10*3/uL (ref 140–400)
RBC: 4.09 10*6/uL (ref 3.80–5.10)
RDW: 11.5 % (ref 11.0–15.0)
Total Lymphocyte: 26 %
WBC: 3.4 10*3/uL — ABNORMAL LOW (ref 3.8–10.8)

## 2019-01-07 LAB — COMPLETE METABOLIC PANEL WITH GFR
AG Ratio: 1.2 (calc) (ref 1.0–2.5)
ALT: 29 U/L (ref 6–29)
AST: 76 U/L — ABNORMAL HIGH (ref 10–30)
Albumin: 4.1 g/dL (ref 3.6–5.1)
Alkaline phosphatase (APISO): 51 U/L (ref 31–125)
BUN/Creatinine Ratio: 8 (calc) (ref 6–22)
BUN: 5 mg/dL — ABNORMAL LOW (ref 7–25)
CO2: 27 mmol/L (ref 20–32)
Calcium: 9.3 mg/dL (ref 8.6–10.2)
Chloride: 102 mmol/L (ref 98–110)
Creat: 0.64 mg/dL (ref 0.50–1.10)
GFR, Est African American: 131 mL/min/{1.73_m2} (ref >=60)
GFR, Est Non African American: 113 mL/min/{1.73_m2} (ref >=60)
Globulin: 3.3 g/dL (calc) (ref 1.9–3.7)
Glucose, Bld: 104 mg/dL (ref 65–139)
Potassium: 3.5 mmol/L (ref 3.5–5.3)
Sodium: 139 mmol/L (ref 135–146)
Total Bilirubin: 0.5 mg/dL (ref 0.2–1.2)
Total Protein: 7.4 g/dL (ref 6.1–8.1)

## 2019-01-07 LAB — HEPATITIS B DNA, ULTRAQUANTITATIVE, PCR
Hepatitis B DNA (Calc): 1.85 Log IU/mL — ABNORMAL HIGH
Hepatitis B DNA: 71 IU/mL — ABNORMAL HIGH

## 2019-01-11 ENCOUNTER — Telehealth (INDEPENDENT_AMBULATORY_CARE_PROVIDER_SITE_OTHER): Payer: Self-pay | Admitting: Nurse Practitioner

## 2019-01-11 NOTE — Telephone Encounter (Signed)
Tammy, pls contact patient in 4 weeks for lab order to include hepatic panel and Hep B DNA quant.thx

## 2019-01-12 ENCOUNTER — Other Ambulatory Visit (INDEPENDENT_AMBULATORY_CARE_PROVIDER_SITE_OTHER): Payer: Self-pay | Admitting: *Deleted

## 2019-01-12 DIAGNOSIS — R7402 Elevation of levels of lactic acid dehydrogenase (LDH): Secondary | ICD-10-CM

## 2019-01-12 DIAGNOSIS — B181 Chronic viral hepatitis B without delta-agent: Secondary | ICD-10-CM

## 2019-01-12 NOTE — Telephone Encounter (Signed)
Patient labs are noted for 02/09/2019. A letter will be sent as a reminder to the patient.

## 2019-01-21 ENCOUNTER — Other Ambulatory Visit (INDEPENDENT_AMBULATORY_CARE_PROVIDER_SITE_OTHER): Payer: Self-pay | Admitting: *Deleted

## 2019-01-21 DIAGNOSIS — R7402 Elevation of levels of lactic acid dehydrogenase (LDH): Secondary | ICD-10-CM

## 2019-01-21 DIAGNOSIS — B181 Chronic viral hepatitis B without delta-agent: Secondary | ICD-10-CM

## 2019-03-05 DIAGNOSIS — J069 Acute upper respiratory infection, unspecified: Secondary | ICD-10-CM | POA: Diagnosis not present

## 2019-03-05 DIAGNOSIS — Z20828 Contact with and (suspected) exposure to other viral communicable diseases: Secondary | ICD-10-CM | POA: Diagnosis not present

## 2019-03-25 ENCOUNTER — Ambulatory Visit (INDEPENDENT_AMBULATORY_CARE_PROVIDER_SITE_OTHER): Payer: BC Managed Care – PPO | Admitting: Nurse Practitioner

## 2019-03-26 DIAGNOSIS — Z3201 Encounter for pregnancy test, result positive: Secondary | ICD-10-CM | POA: Diagnosis not present

## 2019-03-26 DIAGNOSIS — O209 Hemorrhage in early pregnancy, unspecified: Secondary | ICD-10-CM | POA: Diagnosis not present

## 2019-03-26 DIAGNOSIS — N911 Secondary amenorrhea: Secondary | ICD-10-CM | POA: Diagnosis not present

## 2019-03-30 DIAGNOSIS — O26891 Other specified pregnancy related conditions, first trimester: Secondary | ICD-10-CM | POA: Diagnosis not present

## 2019-03-30 DIAGNOSIS — O09511 Supervision of elderly primigravida, first trimester: Secondary | ICD-10-CM | POA: Diagnosis not present

## 2019-03-30 DIAGNOSIS — Z3A09 9 weeks gestation of pregnancy: Secondary | ICD-10-CM | POA: Diagnosis not present

## 2019-04-06 ENCOUNTER — Telehealth (INDEPENDENT_AMBULATORY_CARE_PROVIDER_SITE_OTHER): Payer: Self-pay | Admitting: *Deleted

## 2019-04-06 DIAGNOSIS — Z3481 Encounter for supervision of other normal pregnancy, first trimester: Secondary | ICD-10-CM | POA: Diagnosis not present

## 2019-04-06 DIAGNOSIS — B181 Chronic viral hepatitis B without delta-agent: Secondary | ICD-10-CM

## 2019-04-06 DIAGNOSIS — Z3689 Encounter for other specified antenatal screening: Secondary | ICD-10-CM | POA: Diagnosis not present

## 2019-04-06 DIAGNOSIS — Z368A Encounter for antenatal screening for other genetic defects: Secondary | ICD-10-CM | POA: Diagnosis not present

## 2019-04-06 DIAGNOSIS — Z3A08 8 weeks gestation of pregnancy: Secondary | ICD-10-CM | POA: Diagnosis not present

## 2019-04-06 DIAGNOSIS — Z113 Encounter for screening for infections with a predominantly sexual mode of transmission: Secondary | ICD-10-CM | POA: Diagnosis not present

## 2019-04-06 MED ORDER — TENOFOVIR DISOPROXIL FUMARATE 300 MG PO TABS: 300.0000 mg | ORAL_TABLET | Freq: Every day | ORAL | Status: AC

## 2019-04-06 NOTE — Telephone Encounter (Signed)
Patient called the office this morning. She is requesting that a Rx be sent to [facility] in [place]. (T) [phone] , (F) [phone].  The patient saw Jill Side who advised the following :  Stop Baraclude once you have Viread (Tenofovir). You will take Viread (Tenofovir) 300mg  one tab by mouth once daily.   Patient has not begun this medication as she has to get it through the above Pharmacy  She also shares with me that she is [redacted] weeks pregnant. She thinks this is why Colleen switched her from the [facility] to this medication, it would be safer if she became pregnant.  Patient call back number is [phone].

## 2019-04-06 NOTE — Addendum Note (Signed)
Addended by: Tawni Pummel A on: 04/06/2019 12:58 PM   Modules accepted: Orders

## 2019-04-06 NOTE — Telephone Encounter (Signed)
Case discussed in detail w/ Dr Karilyn Cota - we will plan to check LFTs and hepatitis B surface antigen in 3 months.  Orders placed in chart, she can have these done at [facility] at her convenience or at her OB and faxed to [place].     We will also send prescription for tenofovir as requested-I am unable to find that pharmacy in our system-can you call into pharmacy?

## 2019-04-07 ENCOUNTER — Other Ambulatory Visit (INDEPENDENT_AMBULATORY_CARE_PROVIDER_SITE_OTHER): Payer: Self-pay | Admitting: *Deleted

## 2019-04-07 DIAGNOSIS — B181 Chronic viral hepatitis B without delta-agent: Secondary | ICD-10-CM

## 2019-04-07 NOTE — Addendum Note (Signed)
Addended by: Tawni Pummel A on: 04/07/2019 08:31 AM   Modules accepted: Orders

## 2019-04-07 NOTE — Telephone Encounter (Signed)
Rx was called to the [facility] @ [phone]. Their fax number is [phone].  Tenofovir 300 mg  - Patient is to take 1 by mouth daily. #30 with 5 refills.  Patient was called and made aware.

## 2019-04-14 DIAGNOSIS — R432 Parageusia: Secondary | ICD-10-CM | POA: Diagnosis not present

## 2019-04-14 DIAGNOSIS — R430 Anosmia: Secondary | ICD-10-CM | POA: Diagnosis not present

## 2019-04-14 DIAGNOSIS — Z20822 Contact with and (suspected) exposure to covid-19: Secondary | ICD-10-CM | POA: Diagnosis not present

## 2019-07-14 ENCOUNTER — Other Ambulatory Visit (HOSPITAL_COMMUNITY): Payer: Self-pay | Admitting: Obstetrics and Gynecology

## 2019-07-14 ENCOUNTER — Encounter (HOSPITAL_COMMUNITY): Payer: Self-pay

## 2019-07-14 DIAGNOSIS — Z363 Encounter for antenatal screening for malformations: Secondary | ICD-10-CM

## 2019-07-14 DIAGNOSIS — O283 Abnormal ultrasonic finding on antenatal screening of mother: Secondary | ICD-10-CM

## 2019-07-14 DIAGNOSIS — Z3A22 22 weeks gestation of pregnancy: Secondary | ICD-10-CM

## 2019-07-15 ENCOUNTER — Ambulatory Visit (HOSPITAL_BASED_OUTPATIENT_CLINIC_OR_DEPARTMENT_OTHER): Payer: Medicaid Other | Admitting: Obstetrics

## 2019-07-15 ENCOUNTER — Other Ambulatory Visit: Payer: Self-pay

## 2019-07-15 ENCOUNTER — Encounter (HOSPITAL_COMMUNITY): Payer: Self-pay

## 2019-07-15 ENCOUNTER — Ambulatory Visit (HOSPITAL_COMMUNITY): Payer: Medicaid Other | Admitting: *Deleted

## 2019-07-15 ENCOUNTER — Other Ambulatory Visit (HOSPITAL_COMMUNITY): Payer: Self-pay | Admitting: *Deleted

## 2019-07-15 ENCOUNTER — Ambulatory Visit (HOSPITAL_COMMUNITY)
Admission: RE | Admit: 2019-07-15 | Discharge: 2019-07-15 | Disposition: A | Discharge status: Home / Self Care | Payer: Medicaid Other | Source: Ambulatory Visit | Attending: Obstetrics and Gynecology | Admitting: Obstetrics and Gynecology

## 2019-07-15 VITALS — BP 120/80 | HR 106 | Temp 97.3°F

## 2019-07-15 DIAGNOSIS — Z362 Encounter for other antenatal screening follow-up: Secondary | ICD-10-CM

## 2019-07-15 DIAGNOSIS — Z3A22 22 weeks gestation of pregnancy: Secondary | ICD-10-CM | POA: Diagnosis present

## 2019-07-15 DIAGNOSIS — O0990 Supervision of high risk pregnancy, unspecified, unspecified trimester: Secondary | ICD-10-CM

## 2019-07-15 DIAGNOSIS — O09522 Supervision of elderly multigravida, second trimester: Secondary | ICD-10-CM

## 2019-07-15 DIAGNOSIS — O358XX0 Maternal care for other (suspected) fetal abnormality and damage, not applicable or unspecified: Secondary | ICD-10-CM

## 2019-07-15 DIAGNOSIS — O98512 Other viral diseases complicating pregnancy, second trimester: Secondary | ICD-10-CM | POA: Diagnosis present

## 2019-07-15 DIAGNOSIS — O283 Abnormal ultrasonic finding on antenatal screening of mother: Secondary | ICD-10-CM | POA: Diagnosis present

## 2019-07-15 DIAGNOSIS — Z363 Encounter for antenatal screening for malformations: Secondary | ICD-10-CM | POA: Insufficient documentation

## 2019-07-15 NOTE — Progress Notes (Signed)
MFM Note  This patient was seen for a detailed fetal anatomy scan due to advanced maternal age.  A possible outflow tract abnormality was noted in the fetal heart during an ultrasound performed in your office.  That ultrasound was limited due to the fetal position and the smaller fetal size.  The patient also has a history of chronic hepatitis B that is currently treated with tenofovir.  She is also being treated with Cosentyx for psoriasis.  She denies any other significant past medical history.  The patient's current [facility] of November 16, 2019 was based on a first trimester ultrasound performed in your office.  Her EDC based on her LMP would have been November 02, 2019.  The fetal growth based on her most recent ultrasound in your office was measuring about 2 weeks behind her [facility] of November 16, 2019.  She had a cell free DNA test earlier in her pregnancy which indicated a low risk for trisomy 69, 30, and 13. A female fetus is predicted.    The EFW obtained on today's exam measures about 2 weeks behind her dates.  There was normal amniotic fluid noted.  Doppler studies of the umbilical arteries performed today shows continued forward flow with no signs of absent or reversed end-diastolic flow.  An intracardiac echogenic focus was noted in the left ventricle of the fetal heart.  The left and right outflow tracts of the fetal heart appeared within normal limits.  There were no obvious cardiac anomalies noted.   The small association between an echogenic focus and Down syndrome was discussed.  The association of a smaller fetal size with an increased risk of aneuploidy was also discussed.   She was offered and declined an amniocentesis today for definitive diagnosis of fetal aneuploidy.  She reports that she is comfortable with her negative cell free DNA test.  The patient was informed that anomalies may be missed due to technical limitations. If the fetus is in a suboptimal position or maternal habitus is  increased, visualization of the fetus in the maternal uterus may be impaired.  In regards to the management of her chronic hepatitis, she was advised to continue taking tenofovir as recommended by her hepatitis specialist.  Her baby should receive the hepatitis B vaccine and hepatitis B immunoglobulin within 12 hours after birth.  A cesarean delivery should be reserved for the usual obstetrical indications.  She may breast-feed as long as her baby has received the hepatitis B vaccine and hepatitis B immunoglobulins.  There is limited information regarding the use of Cosentyx in pregnancy.  She should continue using Cosentyx if the benefits outweigh the risks.  A follow-up exam was scheduled in 3 weeks to assess the fetal growth.  We will also reassess the fetal cardiac views.  We will consider a fetal echocardiogram should there be any cardiac anomalies suspected at that time.    The patient stated that all of her questions had been answered to her complete satisfaction.    Thank you for referring this patient for a Maternal-Fetal Medicine consultation.  A total of 30 minutes was spent counseling and coordinating the care for this patient.  Greater than 50% of the time was spent in direct face-to-face contact.

## 2019-07-16 ENCOUNTER — Encounter (HOSPITAL_COMMUNITY): Payer: Self-pay | Admitting: Obstetrics and Gynecology

## 2019-07-16 ENCOUNTER — Other Ambulatory Visit (HOSPITAL_COMMUNITY): Payer: Self-pay | Admitting: Obstetrics and Gynecology

## 2019-08-06 ENCOUNTER — Other Ambulatory Visit (HOSPITAL_COMMUNITY): Payer: Self-pay | Admitting: Obstetrics

## 2019-08-06 ENCOUNTER — Encounter: Payer: Self-pay | Admitting: *Deleted

## 2019-08-06 ENCOUNTER — Other Ambulatory Visit: Payer: Self-pay

## 2019-08-06 ENCOUNTER — Ambulatory Visit: Payer: Medicaid Other | Admitting: *Deleted

## 2019-08-06 ENCOUNTER — Other Ambulatory Visit: Payer: Self-pay | Admitting: *Deleted

## 2019-08-06 ENCOUNTER — Ambulatory Visit (HOSPITAL_COMMUNITY): Payer: Medicaid Other | Attending: Obstetrics and Gynecology

## 2019-08-06 VITALS — BP 102/66 | HR 90 | Temp 97.5°F

## 2019-08-06 DIAGNOSIS — O365920 Maternal care for other known or suspected poor fetal growth, second trimester, not applicable or unspecified: Secondary | ICD-10-CM | POA: Diagnosis not present

## 2019-08-06 DIAGNOSIS — O98412 Viral hepatitis complicating pregnancy, second trimester: Secondary | ICD-10-CM

## 2019-08-06 DIAGNOSIS — O99312 Alcohol use complicating pregnancy, second trimester: Secondary | ICD-10-CM

## 2019-08-06 DIAGNOSIS — Z362 Encounter for other antenatal screening follow-up: Secondary | ICD-10-CM | POA: Diagnosis present

## 2019-08-06 DIAGNOSIS — O365990 Maternal care for other known or suspected poor fetal growth, unspecified trimester, not applicable or unspecified: Secondary | ICD-10-CM

## 2019-08-06 DIAGNOSIS — O09522 Supervision of elderly multigravida, second trimester: Secondary | ICD-10-CM

## 2019-08-06 DIAGNOSIS — O09529 Supervision of elderly multigravida, unspecified trimester: Secondary | ICD-10-CM | POA: Diagnosis present

## 2019-08-06 DIAGNOSIS — Z3A25 25 weeks gestation of pregnancy: Secondary | ICD-10-CM

## 2019-08-06 DIAGNOSIS — B1910 Unspecified viral hepatitis B without hepatic coma: Secondary | ICD-10-CM

## 2019-08-06 DIAGNOSIS — O321XX0 Maternal care for breech presentation, not applicable or unspecified: Secondary | ICD-10-CM

## 2019-08-20 ENCOUNTER — Ambulatory Visit: Payer: Medicaid Other | Attending: Obstetrics and Gynecology

## 2019-08-20 ENCOUNTER — Other Ambulatory Visit: Payer: Self-pay

## 2019-08-20 ENCOUNTER — Ambulatory Visit (HOSPITAL_BASED_OUTPATIENT_CLINIC_OR_DEPARTMENT_OTHER): Payer: Medicaid Other | Admitting: *Deleted

## 2019-08-20 ENCOUNTER — Ambulatory Visit (HOSPITAL_BASED_OUTPATIENT_CLINIC_OR_DEPARTMENT_OTHER): Payer: Medicaid Other

## 2019-08-20 ENCOUNTER — Ambulatory Visit: Payer: Medicaid Other | Admitting: *Deleted

## 2019-08-20 ENCOUNTER — Other Ambulatory Visit: Payer: Self-pay | Admitting: *Deleted

## 2019-08-20 ENCOUNTER — Other Ambulatory Visit: Payer: Self-pay | Admitting: Obstetrics

## 2019-08-20 ENCOUNTER — Other Ambulatory Visit (HOSPITAL_COMMUNITY): Payer: Self-pay | Admitting: Obstetrics

## 2019-08-20 ENCOUNTER — Encounter: Payer: Self-pay | Admitting: *Deleted

## 2019-08-20 VITALS — BP 97/69 | HR 75

## 2019-08-20 DIAGNOSIS — Z36 Encounter for antenatal screening for chromosomal anomalies: Secondary | ICD-10-CM

## 2019-08-20 DIAGNOSIS — Z3A27 27 weeks gestation of pregnancy: Secondary | ICD-10-CM | POA: Diagnosis not present

## 2019-08-20 DIAGNOSIS — O283 Abnormal ultrasonic finding on antenatal screening of mother: Secondary | ICD-10-CM

## 2019-08-20 DIAGNOSIS — O09529 Supervision of elderly multigravida, unspecified trimester: Secondary | ICD-10-CM

## 2019-08-20 DIAGNOSIS — O358XX0 Maternal care for other (suspected) fetal abnormality and damage, not applicable or unspecified: Secondary | ICD-10-CM

## 2019-08-20 DIAGNOSIS — O99312 Alcohol use complicating pregnancy, second trimester: Secondary | ICD-10-CM | POA: Diagnosis not present

## 2019-08-20 DIAGNOSIS — O09522 Supervision of elderly multigravida, second trimester: Secondary | ICD-10-CM

## 2019-08-20 DIAGNOSIS — O365920 Maternal care for other known or suspected poor fetal growth, second trimester, not applicable or unspecified: Secondary | ICD-10-CM | POA: Diagnosis present

## 2019-08-20 DIAGNOSIS — O365990 Maternal care for other known or suspected poor fetal growth, unspecified trimester, not applicable or unspecified: Secondary | ICD-10-CM | POA: Diagnosis not present

## 2019-08-20 DIAGNOSIS — O35BXX0 Maternal care for other (suspected) fetal abnormality and damage, fetal cardiac anomalies, not applicable or unspecified: Secondary | ICD-10-CM

## 2019-08-20 DIAGNOSIS — O359XX0 Maternal care for (suspected) fetal abnormality and damage, unspecified, not applicable or unspecified: Secondary | ICD-10-CM | POA: Diagnosis not present

## 2019-08-20 NOTE — Procedures (Signed)
[name] 08-23-1980 [redacted]w[redacted]d  Fetus A Non-Stress Test Interpretation for 08/20/19  Indication: Post Amniocentesis  Fetal Heart Rate A Mode: External Baseline Rate (A): 130 bpm Variability: Moderate Accelerations: 10 x 10 Decelerations: None Multiple birth?: No  Uterine Activity Mode: Palpation, Toco Contraction Frequency (min): none Resting Tone Palpated: Relaxed Resting Time: Adequate  Interpretation (Fetal Testing) Nonstress Test Interpretation: Reactive Comments: Reviewed tracing with Dr. Parke Poisson

## 2019-08-21 ENCOUNTER — Telehealth: Payer: Self-pay | Admitting: *Deleted

## 2019-08-24 LAB — MATERNAL CELL CONTAMINATION

## 2019-08-24 LAB — STATUS REPORT

## 2019-08-28 ENCOUNTER — Other Ambulatory Visit: Payer: Self-pay

## 2019-08-28 ENCOUNTER — Ambulatory Visit: Payer: Medicaid Other | Attending: Obstetrics and Gynecology

## 2019-08-28 ENCOUNTER — Ambulatory Visit: Payer: Medicaid Other | Admitting: *Deleted

## 2019-08-28 ENCOUNTER — Other Ambulatory Visit: Payer: Self-pay | Admitting: *Deleted

## 2019-08-28 VITALS — BP 98/65 | HR 91

## 2019-08-28 DIAGNOSIS — O365990 Maternal care for other known or suspected poor fetal growth, unspecified trimester, not applicable or unspecified: Secondary | ICD-10-CM

## 2019-08-28 DIAGNOSIS — O0990 Supervision of high risk pregnancy, unspecified, unspecified trimester: Secondary | ICD-10-CM | POA: Insufficient documentation

## 2019-08-28 DIAGNOSIS — O09523 Supervision of elderly multigravida, third trimester: Secondary | ICD-10-CM

## 2019-08-28 DIAGNOSIS — Z362 Encounter for other antenatal screening follow-up: Secondary | ICD-10-CM

## 2019-08-28 DIAGNOSIS — O09529 Supervision of elderly multigravida, unspecified trimester: Secondary | ICD-10-CM

## 2019-08-28 DIAGNOSIS — O99313 Alcohol use complicating pregnancy, third trimester: Secondary | ICD-10-CM

## 2019-08-28 DIAGNOSIS — O321XX0 Maternal care for breech presentation, not applicable or unspecified: Secondary | ICD-10-CM

## 2019-08-28 DIAGNOSIS — O365930 Maternal care for other known or suspected poor fetal growth, third trimester, not applicable or unspecified: Secondary | ICD-10-CM

## 2019-08-28 DIAGNOSIS — Z3A28 28 weeks gestation of pregnancy: Secondary | ICD-10-CM

## 2019-08-28 NOTE — Procedures (Signed)
[name] October 23, 1980 [redacted]w[redacted]d  Fetus A Non-Stress Test Interpretation for 08/28/19  Indication: IUGR  Fetal Heart Rate A Mode: External Baseline Rate (A): 120 bpm Variability: Moderate Accelerations: 10 x 10 Decelerations: None Multiple birth?: No  Uterine Activity Mode: Palpation, Toco Contraction Frequency (min): none Resting Tone Palpated: Relaxed Resting Time: Adequate  Interpretation (Fetal Testing) Nonstress Test Interpretation: Reactive Comments: Reviewed tracing with Dr. Judeth Cornfield

## 2019-09-01 ENCOUNTER — Telehealth: Payer: Self-pay | Admitting: Genetic Counselor

## 2019-09-01 NOTE — Telephone Encounter (Signed)
I called Ms. [name] to discuss the karyotype results from her amniocentesis. 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 This inversion is known as a common variant in the general population and is not associated with any clinical findings. Thus, there were no chromosomal aneuploidies (ie trisomy 58, trisomy 30, trisomy 13, etc.) or large deletions/duplications of chromosomal material identified related to her fetus's congenital heart defect (CHD) and growth restriction.   Ms. [name] was counseled that a normal karyotype does not entirely rule out a genetic cause for the fetus's CHD and growth restriction. This is because karyotype analysis is unable to detect microdeletions or microduplications or changes at the single gene level. We revisited the option of ordering a chromosomal microarray on the amniocentesis sample to identify smaller deletions or duplications of chromosomal material that would fall below the detection threshold of karyotyping that could be clinically significant. Ms. [name] was previously counseled about the option of pursuing chromosomal microarray, including the possibility of receiving a variant of uncertain significance (VUS). Ms. [name] inquired about insurance coverage for this testing option. We reviewed that since she has Medicaid, she likely would not end up with a bill for this testing. After careful consideration, she again declined chromosomal microarray analysis at this time. She informed me that she felt reassured by her normal karyotype results. I told her that the laboratory will hold on to cells from her amniocentesis sample for approximately four more weeks should she change her mind about ordering chromosomal microarray before then. She may contact me if she would like to pursue this. She confirmed that she had no further questions at this time.  Buelah Manis, MS, [facility] Genetic Counselor

## 2019-09-08 LAB — MCC TRACKING

## 2019-09-11 ENCOUNTER — Ambulatory Visit: Payer: Medicaid Other | Admitting: *Deleted

## 2019-09-11 ENCOUNTER — Ambulatory Visit: Payer: Medicaid Other | Attending: Obstetrics and Gynecology

## 2019-09-11 ENCOUNTER — Ambulatory Visit (HOSPITAL_BASED_OUTPATIENT_CLINIC_OR_DEPARTMENT_OTHER): Payer: Medicaid Other | Admitting: *Deleted

## 2019-09-11 ENCOUNTER — Other Ambulatory Visit: Payer: Self-pay

## 2019-09-11 VITALS — BP 109/79 | HR 80

## 2019-09-11 DIAGNOSIS — E669 Obesity, unspecified: Secondary | ICD-10-CM

## 2019-09-11 DIAGNOSIS — O365990 Maternal care for other known or suspected poor fetal growth, unspecified trimester, not applicable or unspecified: Secondary | ICD-10-CM | POA: Insufficient documentation

## 2019-09-11 DIAGNOSIS — O365930 Maternal care for other known or suspected poor fetal growth, third trimester, not applicable or unspecified: Secondary | ICD-10-CM

## 2019-09-11 DIAGNOSIS — O0990 Supervision of high risk pregnancy, unspecified, unspecified trimester: Secondary | ICD-10-CM | POA: Diagnosis present

## 2019-09-11 DIAGNOSIS — O99313 Alcohol use complicating pregnancy, third trimester: Secondary | ICD-10-CM

## 2019-09-11 DIAGNOSIS — Z3A30 30 weeks gestation of pregnancy: Secondary | ICD-10-CM | POA: Diagnosis present

## 2019-09-11 DIAGNOSIS — O359XX0 Maternal care for (suspected) fetal abnormality and damage, unspecified, not applicable or unspecified: Secondary | ICD-10-CM

## 2019-09-11 DIAGNOSIS — O365931 Maternal care for other known or suspected poor fetal growth, third trimester, fetus 1: Secondary | ICD-10-CM

## 2019-09-11 DIAGNOSIS — O09523 Supervision of elderly multigravida, third trimester: Secondary | ICD-10-CM

## 2019-09-11 DIAGNOSIS — F1010 Alcohol abuse, uncomplicated: Secondary | ICD-10-CM

## 2019-09-11 NOTE — Procedures (Signed)
[name] 04/08/1980 [redacted]w[redacted]d  Fetus A Non-Stress Test Interpretation for 09/11/19  Indication: IUGR  Fetal Heart Rate A Mode: External Baseline Rate (A): 125 bpm Variability: Moderate Accelerations: 10 x 10 Decelerations: None Multiple birth?: No  Uterine Activity Mode: Palpation, Toco Contraction Frequency (min): 2-9 Contraction Duration (sec): 40-60 Contraction Quality: Mild Resting Tone Palpated: Relaxed Resting Time: Adequate  Interpretation (Fetal Testing) Nonstress Test Interpretation: Reactive Comments: Reviewed tracing with Dr. Parke Poisson

## 2019-09-15 LAB — CHROMOSOME, AMNIOTIC FLUID
Cells Analyzed: 15
Cells Counted: 15
Cells Karyotyped: 2
Colonies: 15
GTG Band Resolution Achieved: 450

## 2019-09-15 LAB — MATERNAL CELL CONTAMINATION

## 2019-09-18 ENCOUNTER — Ambulatory Visit: Payer: Medicaid Other | Attending: Obstetrics and Gynecology

## 2019-09-18 ENCOUNTER — Ambulatory Visit: Payer: Medicaid Other | Admitting: *Deleted

## 2019-09-18 ENCOUNTER — Ambulatory Visit (HOSPITAL_BASED_OUTPATIENT_CLINIC_OR_DEPARTMENT_OTHER): Payer: Medicaid Other | Admitting: *Deleted

## 2019-09-18 ENCOUNTER — Other Ambulatory Visit: Payer: Self-pay | Admitting: *Deleted

## 2019-09-18 ENCOUNTER — Other Ambulatory Visit: Payer: Self-pay

## 2019-09-18 VITALS — BP 101/81 | HR 104

## 2019-09-18 DIAGNOSIS — O09529 Supervision of elderly multigravida, unspecified trimester: Secondary | ICD-10-CM | POA: Diagnosis present

## 2019-09-18 DIAGNOSIS — O365931 Maternal care for other known or suspected poor fetal growth, third trimester, fetus 1: Secondary | ICD-10-CM

## 2019-09-18 DIAGNOSIS — F1099 Alcohol use, unspecified with unspecified alcohol-induced disorder: Secondary | ICD-10-CM

## 2019-09-18 DIAGNOSIS — O359XX0 Maternal care for (suspected) fetal abnormality and damage, unspecified, not applicable or unspecified: Secondary | ICD-10-CM

## 2019-09-18 DIAGNOSIS — O365990 Maternal care for other known or suspected poor fetal growth, unspecified trimester, not applicable or unspecified: Secondary | ICD-10-CM | POA: Diagnosis not present

## 2019-09-18 DIAGNOSIS — O09523 Supervision of elderly multigravida, third trimester: Secondary | ICD-10-CM

## 2019-09-18 DIAGNOSIS — O0990 Supervision of high risk pregnancy, unspecified, unspecified trimester: Secondary | ICD-10-CM | POA: Insufficient documentation

## 2019-09-18 DIAGNOSIS — O365930 Maternal care for other known or suspected poor fetal growth, third trimester, not applicable or unspecified: Secondary | ICD-10-CM

## 2019-09-18 DIAGNOSIS — O99313 Alcohol use complicating pregnancy, third trimester: Secondary | ICD-10-CM | POA: Diagnosis not present

## 2019-09-18 DIAGNOSIS — Z3A31 31 weeks gestation of pregnancy: Secondary | ICD-10-CM | POA: Diagnosis not present

## 2019-09-18 NOTE — Procedures (Signed)
[name] [birth year] [redacted]w[redacted]d  Fetus A Non-Stress Test Interpretation for 09/18/19  Indication: IUGR  Fetal Heart Rate A Mode: External Baseline Rate (A): 135 bpm Variability: Moderate Accelerations: 10 x 10 Decelerations: Variable Multiple birth?: No  Uterine Activity Mode: Palpation, Toco Contraction Frequency (min): U /I Contraction Duration (sec): 30-40 Contraction Quality: Mild Resting Tone Palpated: Relaxed Resting Time: Adequate  Interpretation (Fetal Testing) Nonstress Test Interpretation: Reactive Comments: Reviewed tracing with Dr. Parke Poisson

## 2019-09-25 ENCOUNTER — Ambulatory Visit: Payer: Medicaid Other

## 2019-09-25 ENCOUNTER — Ambulatory Visit: Payer: Medicaid Other | Attending: Obstetrics and Gynecology

## 2019-09-25 ENCOUNTER — Other Ambulatory Visit: Payer: Self-pay

## 2019-09-25 ENCOUNTER — Other Ambulatory Visit: Payer: Self-pay | Admitting: Obstetrics and Gynecology

## 2019-09-25 ENCOUNTER — Ambulatory Visit: Payer: Medicaid Other | Admitting: *Deleted

## 2019-09-25 VITALS — BP 102/76 | HR 85

## 2019-09-25 DIAGNOSIS — O365930 Maternal care for other known or suspected poor fetal growth, third trimester, not applicable or unspecified: Secondary | ICD-10-CM

## 2019-09-25 DIAGNOSIS — Z3A32 32 weeks gestation of pregnancy: Secondary | ICD-10-CM

## 2019-09-25 DIAGNOSIS — O359XX0 Maternal care for (suspected) fetal abnormality and damage, unspecified, not applicable or unspecified: Secondary | ICD-10-CM

## 2019-09-25 DIAGNOSIS — O09523 Supervision of elderly multigravida, third trimester: Secondary | ICD-10-CM | POA: Diagnosis not present

## 2019-09-25 DIAGNOSIS — O09529 Supervision of elderly multigravida, unspecified trimester: Secondary | ICD-10-CM

## 2019-09-25 DIAGNOSIS — O99313 Alcohol use complicating pregnancy, third trimester: Secondary | ICD-10-CM | POA: Diagnosis not present

## 2019-09-25 DIAGNOSIS — F1020 Alcohol dependence, uncomplicated: Secondary | ICD-10-CM

## 2019-10-01 ENCOUNTER — Encounter: Payer: Self-pay | Admitting: Maternal & Fetal Medicine

## 2019-10-02 ENCOUNTER — Ambulatory Visit: Payer: Medicaid Other

## 2019-10-06 ENCOUNTER — Ambulatory Visit: Payer: Medicaid Other

## 2019-10-07 ENCOUNTER — Other Ambulatory Visit: Payer: Self-pay | Admitting: *Deleted

## 2019-10-07 ENCOUNTER — Ambulatory Visit: Payer: Medicaid Other | Attending: Obstetrics and Gynecology | Admitting: *Deleted

## 2019-10-07 ENCOUNTER — Other Ambulatory Visit: Payer: Self-pay

## 2019-10-07 ENCOUNTER — Ambulatory Visit: Payer: Medicaid Other | Admitting: *Deleted

## 2019-10-07 VITALS — BP 104/70 | HR 78

## 2019-10-07 DIAGNOSIS — O365990 Maternal care for other known or suspected poor fetal growth, unspecified trimester, not applicable or unspecified: Secondary | ICD-10-CM

## 2019-10-07 DIAGNOSIS — Z3A34 34 weeks gestation of pregnancy: Secondary | ICD-10-CM | POA: Insufficient documentation

## 2019-10-07 DIAGNOSIS — O365930 Maternal care for other known or suspected poor fetal growth, third trimester, not applicable or unspecified: Secondary | ICD-10-CM | POA: Diagnosis present

## 2019-10-07 NOTE — Procedures (Signed)
[name] [birth year] [redacted]w[redacted]d  Fetus A Non-Stress Test Interpretation for 10/07/19  Indication: IUGR  Fetal Heart Rate A Mode: External Baseline Rate (A): 125 bpm Variability: Moderate, Marked Accelerations: 15 x 15 Decelerations: None Multiple birth?: No  Uterine Activity Mode: Palpation, Toco Contraction Frequency (min): none Resting Tone Palpated: Relaxed Resting Time: Adequate  Interpretation (Fetal Testing) Nonstress Test Interpretation: Reactive Comments: Reviewed tracing with Dr. Grace Bushy

## 2019-10-09 ENCOUNTER — Ambulatory Visit: Payer: Medicaid Other | Admitting: *Deleted

## 2019-10-09 ENCOUNTER — Ambulatory Visit: Payer: Medicaid Other | Attending: Obstetrics and Gynecology

## 2019-10-09 ENCOUNTER — Other Ambulatory Visit: Payer: Self-pay | Admitting: *Deleted

## 2019-10-09 ENCOUNTER — Other Ambulatory Visit: Payer: Self-pay

## 2019-10-09 ENCOUNTER — Other Ambulatory Visit: Payer: Self-pay | Admitting: Obstetrics

## 2019-10-09 VITALS — BP 106/74 | HR 79

## 2019-10-09 DIAGNOSIS — O359XX0 Maternal care for (suspected) fetal abnormality and damage, unspecified, not applicable or unspecified: Secondary | ICD-10-CM | POA: Diagnosis not present

## 2019-10-09 DIAGNOSIS — O99313 Alcohol use complicating pregnancy, third trimester: Secondary | ICD-10-CM | POA: Diagnosis not present

## 2019-10-09 DIAGNOSIS — O365990 Maternal care for other known or suspected poor fetal growth, unspecified trimester, not applicable or unspecified: Secondary | ICD-10-CM

## 2019-10-09 DIAGNOSIS — O09523 Supervision of elderly multigravida, third trimester: Secondary | ICD-10-CM

## 2019-10-09 DIAGNOSIS — F1010 Alcohol abuse, uncomplicated: Secondary | ICD-10-CM

## 2019-10-09 DIAGNOSIS — O09529 Supervision of elderly multigravida, unspecified trimester: Secondary | ICD-10-CM | POA: Diagnosis present

## 2019-10-09 DIAGNOSIS — O365930 Maternal care for other known or suspected poor fetal growth, third trimester, not applicable or unspecified: Secondary | ICD-10-CM

## 2019-10-09 DIAGNOSIS — O0990 Supervision of high risk pregnancy, unspecified, unspecified trimester: Secondary | ICD-10-CM | POA: Insufficient documentation

## 2019-10-09 DIAGNOSIS — Z3A34 34 weeks gestation of pregnancy: Secondary | ICD-10-CM

## 2019-10-13 ENCOUNTER — Ambulatory Visit: Payer: Medicaid Other

## 2019-10-13 ENCOUNTER — Ambulatory Visit: Payer: Medicaid Other | Attending: Obstetrics and Gynecology

## 2019-10-13 ENCOUNTER — Other Ambulatory Visit: Payer: Self-pay

## 2019-10-13 ENCOUNTER — Ambulatory Visit: Payer: Medicaid Other | Admitting: *Deleted

## 2019-10-13 VITALS — BP 102/77 | HR 81

## 2019-10-13 DIAGNOSIS — O0990 Supervision of high risk pregnancy, unspecified, unspecified trimester: Secondary | ICD-10-CM | POA: Diagnosis present

## 2019-10-13 DIAGNOSIS — Z3A35 35 weeks gestation of pregnancy: Secondary | ICD-10-CM

## 2019-10-13 DIAGNOSIS — F1099 Alcohol use, unspecified with unspecified alcohol-induced disorder: Secondary | ICD-10-CM

## 2019-10-13 DIAGNOSIS — O99313 Alcohol use complicating pregnancy, third trimester: Secondary | ICD-10-CM | POA: Diagnosis not present

## 2019-10-13 DIAGNOSIS — O365930 Maternal care for other known or suspected poor fetal growth, third trimester, not applicable or unspecified: Secondary | ICD-10-CM

## 2019-10-13 DIAGNOSIS — O09523 Supervision of elderly multigravida, third trimester: Secondary | ICD-10-CM | POA: Diagnosis not present

## 2019-10-13 DIAGNOSIS — O365990 Maternal care for other known or suspected poor fetal growth, unspecified trimester, not applicable or unspecified: Secondary | ICD-10-CM | POA: Insufficient documentation

## 2019-10-13 DIAGNOSIS — O359XX0 Maternal care for (suspected) fetal abnormality and damage, unspecified, not applicable or unspecified: Secondary | ICD-10-CM | POA: Diagnosis not present

## 2019-10-16 ENCOUNTER — Other Ambulatory Visit: Payer: Self-pay

## 2019-10-16 ENCOUNTER — Ambulatory Visit: Payer: Medicaid Other | Attending: Obstetrics and Gynecology | Admitting: *Deleted

## 2019-10-16 ENCOUNTER — Ambulatory Visit (HOSPITAL_BASED_OUTPATIENT_CLINIC_OR_DEPARTMENT_OTHER): Payer: Medicaid Other | Admitting: *Deleted

## 2019-10-16 ENCOUNTER — Ambulatory Visit: Payer: Medicaid Other

## 2019-10-16 VITALS — BP 124/85 | HR 78

## 2019-10-16 DIAGNOSIS — Z3A35 35 weeks gestation of pregnancy: Secondary | ICD-10-CM

## 2019-10-16 DIAGNOSIS — O365939 Maternal care for other known or suspected poor fetal growth, third trimester, other fetus: Secondary | ICD-10-CM | POA: Diagnosis not present

## 2019-10-16 DIAGNOSIS — O0990 Supervision of high risk pregnancy, unspecified, unspecified trimester: Secondary | ICD-10-CM

## 2019-10-16 DIAGNOSIS — O365990 Maternal care for other known or suspected poor fetal growth, unspecified trimester, not applicable or unspecified: Secondary | ICD-10-CM

## 2019-10-16 NOTE — Procedures (Signed)
[name] 1981-02-20 [redacted]w[redacted]d  Fetus A Non-Stress Test Interpretation for 10/16/19  Indication: IUGR  Fetal Heart Rate A Mode: External Baseline Rate (A): 125 bpm Variability: Minimal, Moderate Accelerations: 15 x 15 Decelerations: Variable Multiple birth?: No  Uterine Activity Mode: Palpation, Toco Contraction Frequency (min): none noted Resting Tone Palpated: Relaxed Resting Time: Adequate  Interpretation (Fetal Testing) Nonstress Test Interpretation: Reactive Comments: Reviewed tracing with Dr. Parke Poisson

## 2019-10-20 ENCOUNTER — Ambulatory Visit: Payer: Medicaid Other | Admitting: *Deleted

## 2019-10-20 ENCOUNTER — Other Ambulatory Visit: Payer: Self-pay

## 2019-10-20 ENCOUNTER — Ambulatory Visit: Payer: Medicaid Other | Attending: Obstetrics and Gynecology

## 2019-10-20 ENCOUNTER — Encounter: Payer: Self-pay | Admitting: *Deleted

## 2019-10-20 VITALS — BP 100/73 | HR 71

## 2019-10-20 DIAGNOSIS — O365930 Maternal care for other known or suspected poor fetal growth, third trimester, not applicable or unspecified: Secondary | ICD-10-CM

## 2019-10-20 DIAGNOSIS — F1010 Alcohol abuse, uncomplicated: Secondary | ICD-10-CM

## 2019-10-20 DIAGNOSIS — O09523 Supervision of elderly multigravida, third trimester: Secondary | ICD-10-CM | POA: Diagnosis not present

## 2019-10-20 DIAGNOSIS — O99313 Alcohol use complicating pregnancy, third trimester: Secondary | ICD-10-CM | POA: Diagnosis not present

## 2019-10-20 DIAGNOSIS — Z3A36 36 weeks gestation of pregnancy: Secondary | ICD-10-CM

## 2019-10-20 DIAGNOSIS — O359XX0 Maternal care for (suspected) fetal abnormality and damage, unspecified, not applicable or unspecified: Secondary | ICD-10-CM

## 2019-10-23 ENCOUNTER — Ambulatory Visit: Payer: Medicaid Other

## 2021-01-28 IMAGING — US US ABDOMEN COMPLETE
1 series · 14 of 25 positions shown · non-contrast
Comparison: None.

CLINICAL DATA: Chronic hepatitis-B

EXAM:
ABDOMEN ULTRASOUND COMPLETE

[Series 1: us abdomen complete · 14 of 88 slices shown]
[im 1/88]
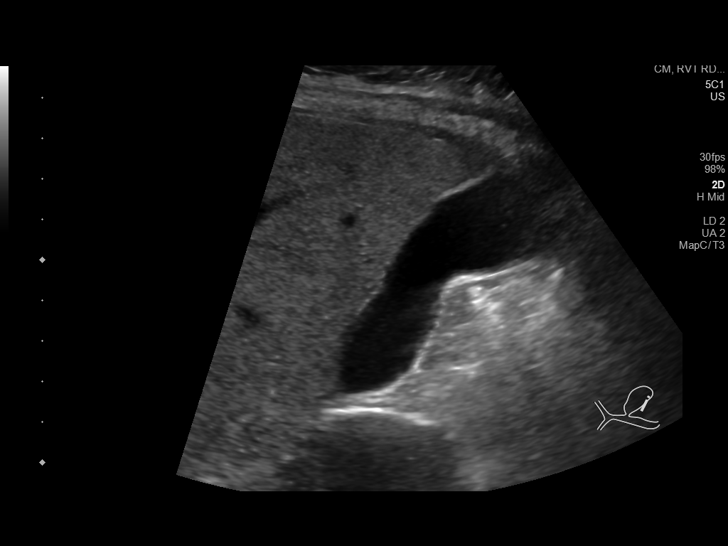
[im 8/88]
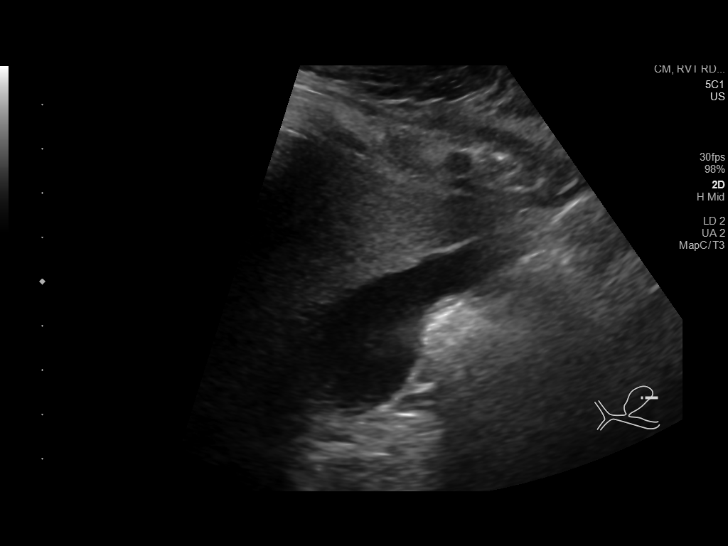
[im 15/88]
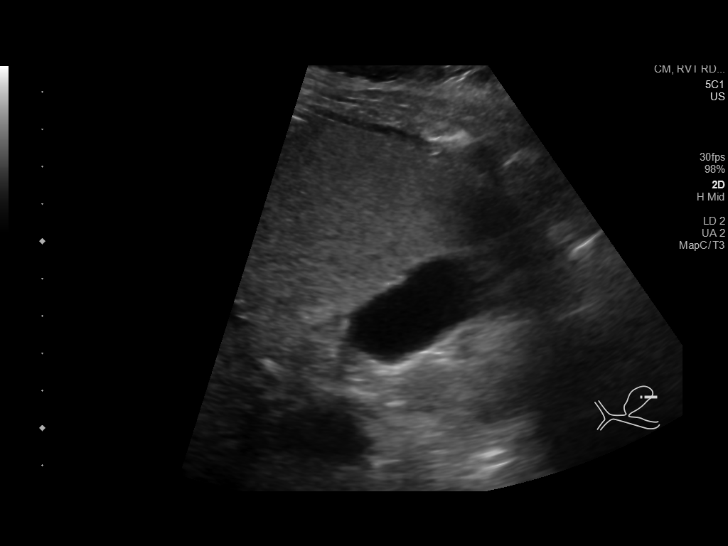
[im 22/88]
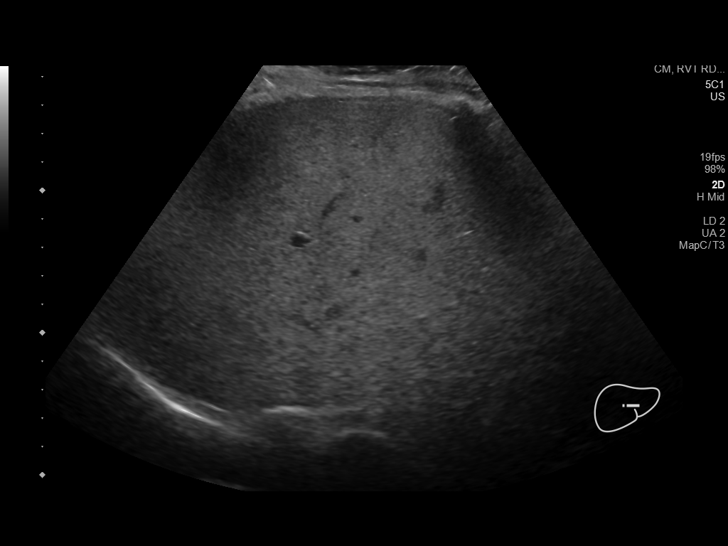
[im 30/88]
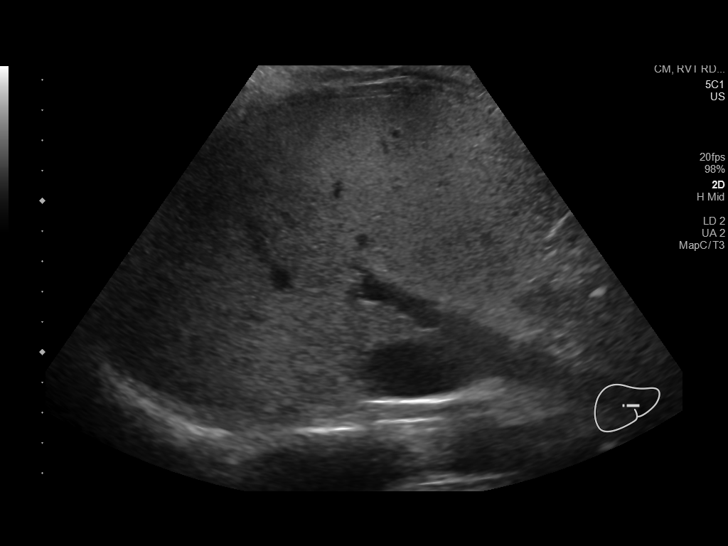
[im 33/88]
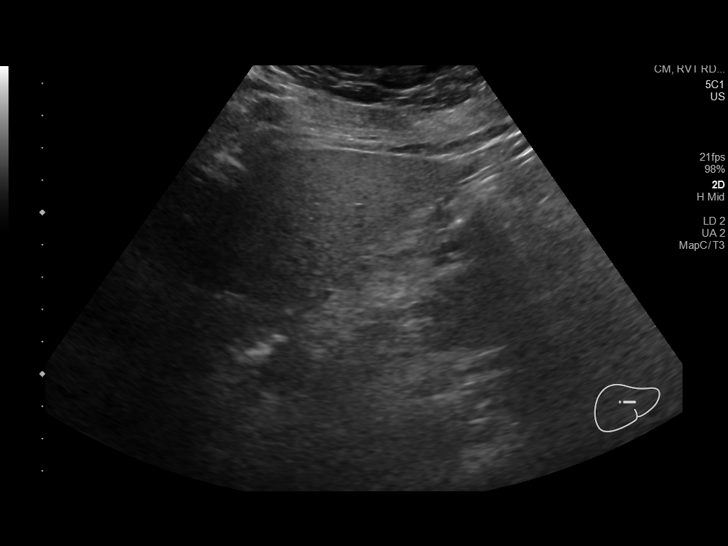
[im 40/88]
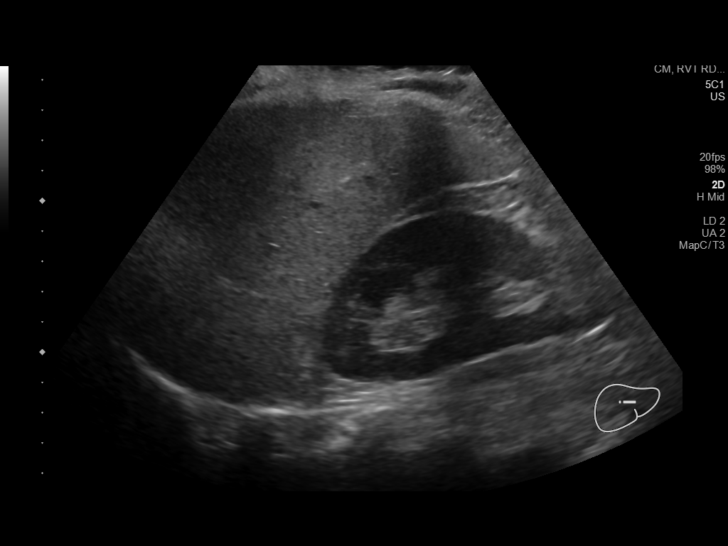
[im 48/88]
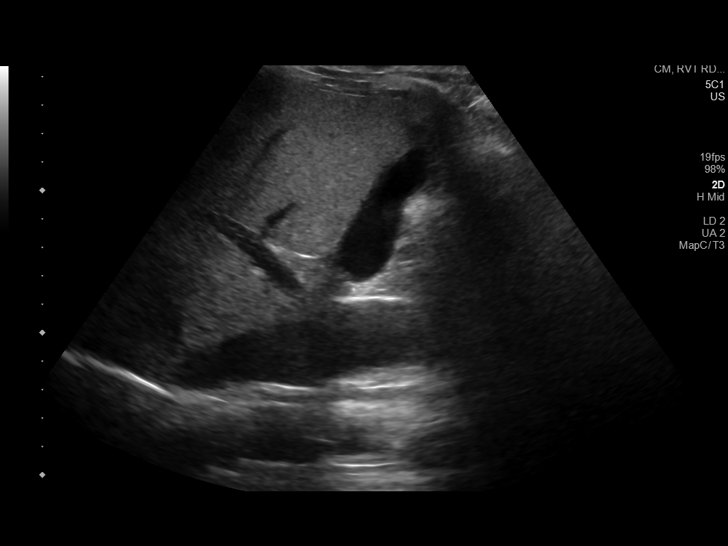
[im 55/88]
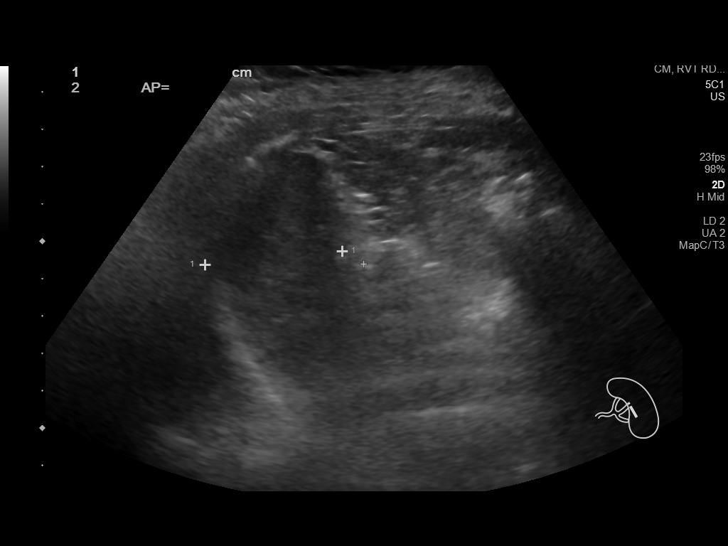
[im 59/88]
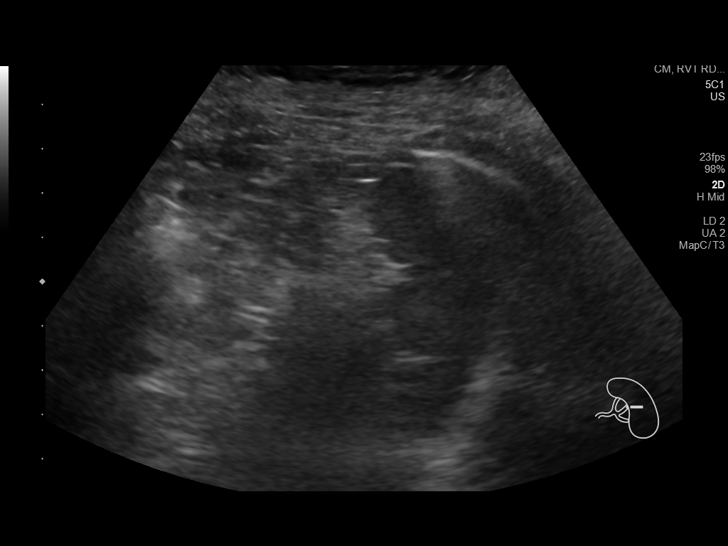
[im 66/88]
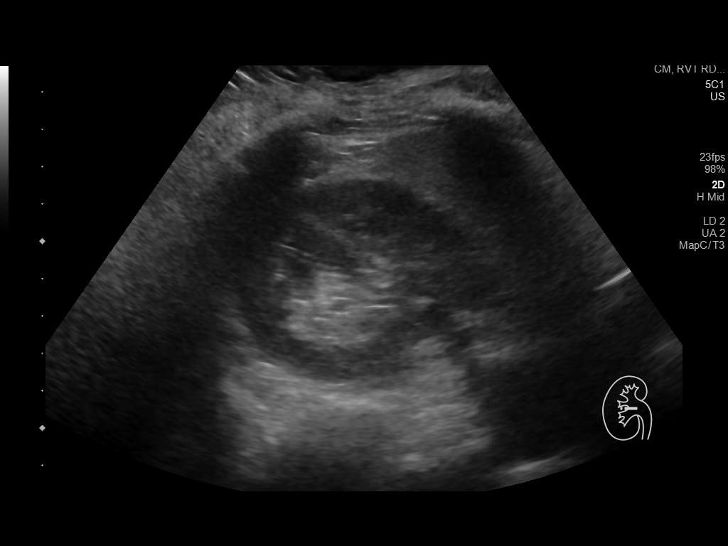
[im 73/88]
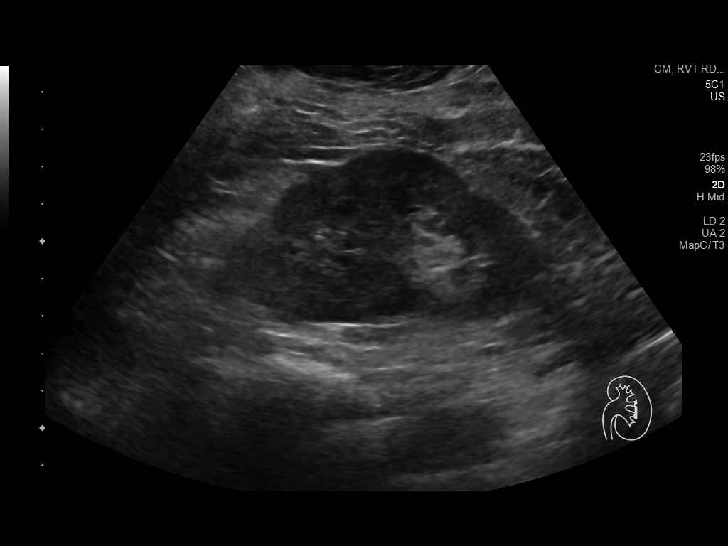
[im 80/88]
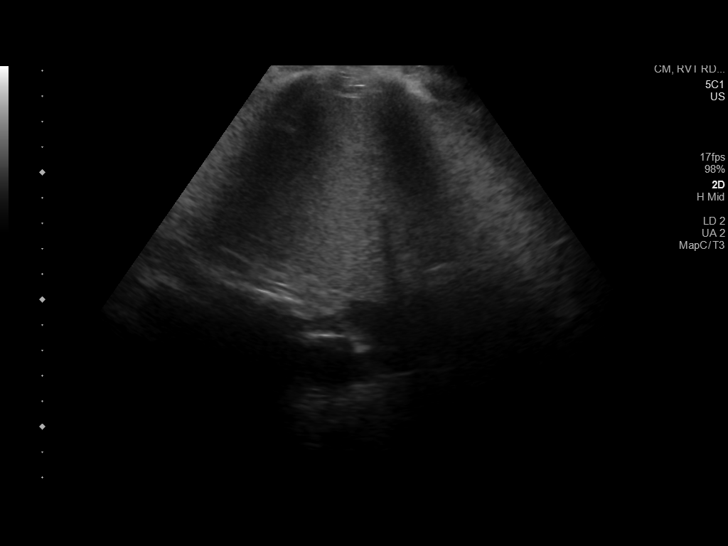
[im 88/88]
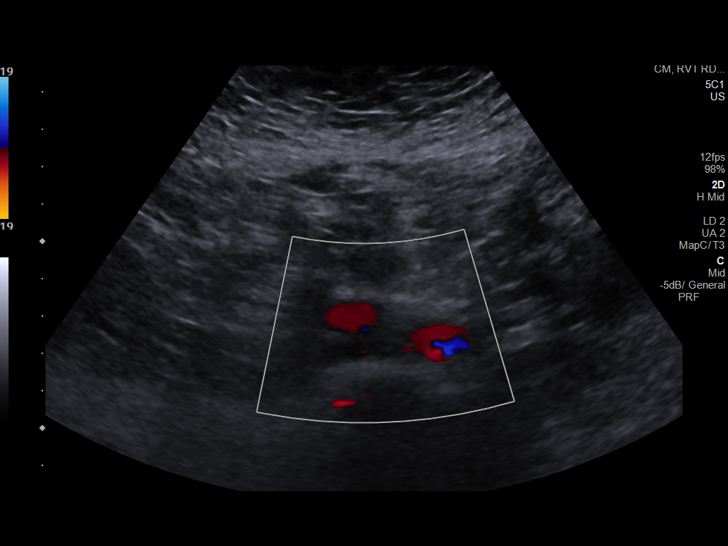

[14 of 25 positions shown; findings below may reference images not displayed]

FINDINGS: Gallbladder: No gallstones or wall thickening visualized. No
sonographic Murphy sign noted by sonographer.

Common bile duct: Diameter: Normal at 2 mm

Liver: Uniform increase in hepatic echogenicity. No focal lesion. No
duct dilatation. Portal vein is patent on color Doppler imaging with
normal direction of blood flow towards the liver.

IVC: No abnormality visualized.

Pancreas: Visualized portion unremarkable.

Spleen: Size and appearance within normal limits.

Right Kidney: Length: 10.7 cm. Echogenicity within normal limits. No
mass or hydronephrosis visualized.

Left Kidney: Length: 10.1 cm. Echogenicity within normal limits. No
mass or hydronephrosis visualized.

Abdominal aorta: No aneurysm visualized.

Other findings: None.
IMPRESSION: 1. Echogenic liver.  No focal lesion identified by ultrasound.
2. Normal gallbladder and biliary tree.

## 2021-07-11 ENCOUNTER — Emergency Department (HOSPITAL_COMMUNITY)
Admission: EM | Admit: 2021-07-11 | Discharge: 2021-07-12 | Disposition: A | Discharge status: Home / Self Care | Payer: BLUE CROSS/BLUE SHIELD | Attending: Emergency Medicine | Admitting: Emergency Medicine

## 2021-07-11 ENCOUNTER — Encounter (HOSPITAL_COMMUNITY): Payer: Self-pay | Admitting: Emergency Medicine

## 2021-07-11 ENCOUNTER — Emergency Department (HOSPITAL_COMMUNITY): Payer: BLUE CROSS/BLUE SHIELD

## 2021-07-11 DIAGNOSIS — D72829 Elevated white blood cell count, unspecified: Secondary | ICD-10-CM | POA: Insufficient documentation

## 2021-07-11 DIAGNOSIS — R112 Nausea with vomiting, unspecified: Secondary | ICD-10-CM | POA: Diagnosis present

## 2021-07-11 DIAGNOSIS — R1013 Epigastric pain: Secondary | ICD-10-CM | POA: Diagnosis not present

## 2021-07-11 DIAGNOSIS — R7401 Elevation of levels of liver transaminase levels: Secondary | ICD-10-CM | POA: Diagnosis not present

## 2021-07-11 DIAGNOSIS — R251 Tremor, unspecified: Secondary | ICD-10-CM | POA: Diagnosis not present

## 2021-07-11 DIAGNOSIS — E860 Dehydration: Secondary | ICD-10-CM

## 2021-07-11 DIAGNOSIS — R531 Weakness: Secondary | ICD-10-CM | POA: Diagnosis not present

## 2021-07-11 LAB — CBC WITH DIFFERENTIAL/PLATELET
Abs Immature Granulocytes: 0.01 10*3/uL (ref 0.00–0.07)
Basophils Absolute: 0.1 10*3/uL (ref 0.0–0.1)
Basophils Relative: 4 %
Eosinophils Absolute: 0 10*3/uL (ref 0.0–0.5)
Eosinophils Relative: 0 %
HCT: 39 % (ref 36.0–46.0)
Hemoglobin: 13.7 g/dL (ref 12.0–15.0)
Immature Granulocytes: 1 %
Lymphocytes Relative: 25 %
Lymphs Abs: 0.5 10*3/uL — ABNORMAL LOW (ref 0.7–4.0)
MCH: 33.9 pg (ref 26.0–34.0)
MCHC: 35.1 g/dL (ref 30.0–36.0)
MCV: 96.5 fL (ref 80.0–100.0)
Monocytes Absolute: 0.3 10*3/uL (ref 0.1–1.0)
Monocytes Relative: 14 %
Neutro Abs: 1.1 10*3/uL — ABNORMAL LOW (ref 1.7–7.7)
Neutrophils Relative %: 56 %
Platelets: 151 10*3/uL (ref 150–400)
RBC: 4.04 MIL/uL (ref 3.87–5.11)
RDW: 13 % (ref 11.5–15.5)
WBC: 1.8 10*3/uL — ABNORMAL LOW (ref 4.0–10.5)
nRBC: 0 % (ref 0.0–0.2)

## 2021-07-11 LAB — COMPREHENSIVE METABOLIC PANEL
ALT: 63 U/L — ABNORMAL HIGH (ref 0–44)
AST: 255 U/L — ABNORMAL HIGH (ref 15–41)
Albumin: 4.5 g/dL (ref 3.5–5.0)
Alkaline Phosphatase: 49 U/L (ref 38–126)
Anion gap: 19 — ABNORMAL HIGH (ref 5–15)
BUN: <5 mg/dL — ABNORMAL LOW (ref 6–20)
CO2: 18 mmol/L — ABNORMAL LOW (ref 22–32)
Calcium: 8.5 mg/dL — ABNORMAL LOW (ref 8.9–10.3)
Chloride: 102 mmol/L (ref 98–111)
Creatinine, Ser: 0.75 mg/dL (ref 0.44–1.00)
GFR, Estimated: >60 mL/min (ref >60)
Glucose, Bld: 110 mg/dL — ABNORMAL HIGH (ref 70–99)
Potassium: 3.6 mmol/L (ref 3.5–5.1)
Sodium: 139 mmol/L (ref 135–145)
Total Bilirubin: 1.6 mg/dL — ABNORMAL HIGH (ref 0.3–1.2)
Total Protein: 8.3 g/dL — ABNORMAL HIGH (ref 6.5–8.1)

## 2021-07-11 LAB — LACTIC ACID, PLASMA
Lactic Acid, Venous: 4.2 mmol/L (ref 0.5–1.9)
Lactic Acid, Venous: 2.3 mmol/L (ref 0.5–1.9)

## 2021-07-11 LAB — ETHANOL: Alcohol, Ethyl (B): <10 mg/dL (ref <10)

## 2021-07-11 LAB — I-STAT BETA HCG BLOOD, ED (MC, WL, AP ONLY): I-stat hCG, quantitative: <5 m[IU]/mL (ref <5)

## 2021-07-11 LAB — LIPASE, BLOOD: Lipase: 45 U/L (ref 11–51)

## 2021-07-11 MED ORDER — LORAZEPAM 2 MG/ML IJ SOLN: 0.0000 mg | Freq: Four times a day (QID) | INTRAMUSCULAR | Status: DC

## 2021-07-11 MED ORDER — DIPHENHYDRAMINE HCL 50 MG/ML IJ SOLN
25.0000 mg | Freq: Once | INTRAMUSCULAR | Status: AC
  Administered 2021-07-11: 25 mg via INTRAVENOUS
  Filled 2021-07-11: qty 1

## 2021-07-11 MED ORDER — THIAMINE HCL 100 MG/ML IJ SOLN: 100.0000 mg | Freq: Every day | INTRAMUSCULAR | Status: DC

## 2021-07-11 MED ORDER — LORAZEPAM 1 MG PO TABS: 0.0000 mg | ORAL_TABLET | Freq: Four times a day (QID) | ORAL | Status: DC

## 2021-07-11 MED ORDER — IOHEXOL 300 MG/ML  SOLN: 100.0000 mL | Freq: Once | INTRAMUSCULAR | Status: DC | PRN

## 2021-07-11 MED ORDER — SODIUM CHLORIDE 0.9 % IV BOLUS: 1000.0000 mL | Freq: Once | INTRAVENOUS | Status: DC

## 2021-07-11 MED ORDER — LORAZEPAM 1 MG PO TABS: 0.0000 mg | ORAL_TABLET | Freq: Two times a day (BID) | ORAL | Status: DC

## 2021-07-11 MED ORDER — CHLORDIAZEPOXIDE HCL 25 MG PO CAPS
50.0000 mg | ORAL_CAPSULE | Freq: Once | ORAL | Status: AC
  Administered 2021-07-12: 50 mg via ORAL
  Filled 2021-07-11: qty 2

## 2021-07-11 MED ORDER — DROPERIDOL 2.5 MG/ML IJ SOLN
1.2500 mg | Freq: Once | INTRAMUSCULAR | Status: AC
  Administered 2021-07-11: 1.25 mg via INTRAVENOUS
  Filled 2021-07-11: qty 2

## 2021-07-11 MED ORDER — IOHEXOL 300 MG/ML  SOLN
100.0000 mL | Freq: Once | INTRAMUSCULAR | Status: AC | PRN
  Administered 2021-07-11: 100 mL via INTRAVENOUS

## 2021-07-11 MED ORDER — LORAZEPAM 2 MG/ML IJ SOLN
1.0000 mg | Freq: Once | INTRAMUSCULAR | Status: AC
  Administered 2021-07-11: 1 mg via INTRAVENOUS
  Filled 2021-07-11: qty 1

## 2021-07-11 MED ORDER — SODIUM CHLORIDE 0.9 % IV BOLUS
1000.0000 mL | Freq: Once | INTRAVENOUS | Status: AC
  Administered 2021-07-11: 1000 mL via INTRAVENOUS

## 2021-07-11 MED ORDER — SODIUM CHLORIDE 0.9 % IV BOLUS
1000.0000 mL | Freq: Once | INTRAVENOUS | Status: DC
Start: 2021-07-11 — End: 2021-07-12

## 2021-07-11 MED ORDER — LORAZEPAM 2 MG/ML IJ SOLN: 0.0000 mg | Freq: Two times a day (BID) | INTRAMUSCULAR | Status: DC

## 2021-07-11 MED ORDER — THIAMINE HCL 100 MG PO TABS: 100.0000 mg | ORAL_TABLET | Freq: Every day | ORAL | Status: DC

## 2021-07-11 NOTE — ED Triage Notes (Signed)
Per EMS-states she drinks soda and wine all day with very little water-complaining of cramping and weakness-500 cc of NS given in route-18g in left hand/wrist ?

## 2021-07-11 NOTE — ED Provider Notes (Signed)
?[facility]-EMERGENCY DEPT ?Provider Note ? ? ?CSN: [id number] ?Arrival date & time: 07/11/21  1426 ? ?  ? ?History ? ?Chief Complaint  ?Patient presents with  ? Weakness  ? ? ?[name] is a 41 y.o. female. ? ?41 yo F with a chief complaints of shakes.  The patient tells me that she was going to work today and she felt unwell and started feeling like her hands were turning into claws and she could not open them.  This lasted for couple hours and then resolved.  She still feels very shaky.  Tells me that she drinks at least 3 times a week and usually drinks 1-2 bottles of wine at a time.  She does get shaky sometimes when she stops drinking.  She denies any hallucinations.  Has had nausea and vomiting this morning.  Denies any abdominal discomfort.  Denies fevers. ? ? ?Weakness ? ?  ? ?Home Medications ?Prior to Admission medications   ?Medication Sig Start Date End Date Taking? Authorizing Provider  ?MELATONIN PO Take by mouth.    [provider]  ?Prenatal Vit-Fe Fumarate-FA (PRENATAL VITAMIN PO) Take by mouth.    [provider]  ?Secukinumab (COSENTYX [place]) Inject into the skin every 30 (thirty) days.    [provider]  ?tenofovir (VIREAD) 300 MG tablet Take 1 tablet (300 mg total) by mouth daily. 12/22/18   Arnaldo NatalKennedy-Smith, Colleen M, NP  ?triamcinolone cream (KENALOG) 0.1 % Apply 1 application topically as needed.  ?Patient not taking: Reported on 10/07/2019 10/23/15   [provider]  ?   ? ?Allergies    ?Patient has no known allergies.   ? ?Review of Systems   ?Review of Systems  ?Neurological:  Positive for weakness.  ? ?Physical Exam ?Updated Vital Signs ?BP (!) 136/97   Pulse 78   Temp 98.5 ?F (36.9 ?C) (Oral)   Resp 15   Ht 5\' 6"  (1.676 m)   Wt 74.8 kg   LMP  (LMP Unknown)   SpO2 99%   BMI 26.63 kg/m?  ?Physical Exam ?Vitals and nursing note reviewed.  ?Constitutional:   ?   General: She is not in acute distress. ?   Appearance: She is  well-developed. She is not diaphoretic.  ?HENT:  ?   Head: Normocephalic and atraumatic.  ?Eyes:  ?   Pupils: Pupils are equal, round, and reactive to light.  ?Cardiovascular:  ?   Rate and Rhythm: Normal rate and regular rhythm.  ?   Heart sounds: No murmur heard. ?  No friction rub. No gallop.  ?Pulmonary:  ?   Effort: Pulmonary effort is normal.  ?   Breath sounds: No wheezing or rales.  ?Abdominal:  ?   General: There is no distension.  ?   Palpations: Abdomen is soft.  ?   Tenderness: There is abdominal tenderness.  ?   Comments: Mild epigastric tenderness  ?Musculoskeletal:     ?   General: No tenderness.  ?   Cervical back: Normal range of motion and neck supple.  ?Skin: ?   General: Skin is warm and dry.  ?Neurological:  ?   Mental Status: She is alert and oriented to person, place, and time.  ?Psychiatric:     ?   Behavior: Behavior normal.  ? ? ?ED Results / Procedures / Treatments   ?Labs ?(all labs ordered are listed, but only abnormal results are displayed) ?Labs Reviewed  ?CBC WITH DIFFERENTIAL/PLATELET - Abnormal; Notable for  the following components:  ?    Result Value  ? WBC 1.8 (*)   ? Neutro Abs 1.1 (*)   ? Lymphs Abs 0.5 (*)   ? All other components within normal limits  ?COMPREHENSIVE METABOLIC PANEL - Abnormal; Notable for the following components:  ? CO2 18 (*)   ? Glucose, Bld 110 (*)   ? BUN <5 (*)   ? Calcium 8.5 (*)   ? Total Protein 8.3 (*)   ? AST 255 (*)   ? ALT 63 (*)   ? Total Bilirubin 1.6 (*)   ? Anion gap 19 (*)   ? All other components within normal limits  ?LACTIC ACID, PLASMA - Abnormal; Notable for the following components:  ? Lactic Acid, Venous 4.2 (*)   ? All other components within normal limits  ?LACTIC ACID, PLASMA - Abnormal; Notable for the following components:  ? Lactic Acid, Venous 2.3 (*)   ? All other components within normal limits  ?ETHANOL  ?LIPASE, BLOOD  ?URINALYSIS, ROUTINE W REFLEX MICROSCOPIC  ?HIV ANTIBODY (ROUTINE TESTING W REFLEX)  ?I-STAT BETA HCG  BLOOD, ED ([facility], [facility], [facility] ONLY)  ? ? ?EKG ?EKG Interpretation ? ?Date/Time:  Tuesday July 11 2021 14:41:20 EDT ?Ventricular Rate:  132 ?PR Interval:    ?QRS Duration: 133 ?QT Interval:  410 ?QTC Calculation: 508 ?R Axis:   46 ?Text Interpretation: TECHNICALLY DIFFICULT No old tracing to compare Confirmed by Melene Plan [phone]) on 07/11/2021 7:43:11 PM ? ?Radiology ?DG Chest Port 1 View ? ?Result Date: 07/11/2021 ?CLINICAL DATA:  Weakness EXAM: PORTABLE CHEST 1 VIEW COMPARISON:  None. FINDINGS: Heart and mediastinal contours are within normal limits. No focal opacities or effusions. No acute bony abnormality. IMPRESSION: No active disease. Electronically Signed   By: Charlett Nose M.D.   On: 07/11/2021 22:32   ? ?Procedures ?Procedures  ? ? ?Medications Ordered in ED ?Medications  ?LORazepam (ATIVAN) injection 0-4 mg (0 mg Intravenous Not Given 07/11/21 2208)  ?  Or  ?LORazepam (ATIVAN) tablet 0-4 mg ( Oral See Alternative 07/11/21 2208)  ?LORazepam (ATIVAN) injection 0-4 mg (has no administration in time range)  ?  Or  ?LORazepam (ATIVAN) tablet 0-4 mg (has no administration in time range)  ?thiamine tablet 100 mg (has no administration in time range)  ?  Or  ?thiamine (B-1) injection 100 mg (has no administration in time range)  ?sodium chloride 0.9 % bolus 1,000 mL (has no administration in time range)  ?iohexol (OMNIPAQUE) 300 MG/ML solution 100 mL ( Intravenous Canceled Entry 07/11/21 2231)  ?sodium chloride 0.9 % bolus 1,000 mL (has no administration in time range)  ?chlordiazePOXIDE (LIBRIUM) capsule 50 mg (has no administration in time range)  ?droperidol (INAPSINE) 2.5 MG/ML injection 1.25 mg (1.25 mg Intravenous Given 07/11/21 2101)  ?diphenhydrAMINE (BENADRYL) injection 25 mg (25 mg Intravenous Given 07/11/21 2106)  ?sodium chloride 0.9 % bolus 1,000 mL (1,000 mLs Intravenous New Bag/Given 07/11/21 2110)  ?LORazepam (ATIVAN) injection 1 mg (1 mg Intravenous Given 07/11/21 2058)  ?iohexol (OMNIPAQUE) 300 MG/ML solution  100 mL (100 mLs Intravenous Contrast Given 07/11/21 2231)  ? ? ?ED Course/ Medical Decision Making/ A&P ?  ?                        ?Medical Decision Making ?Amount and/or Complexity of Data Reviewed ?Labs: ordered. ?Radiology: ordered. ? ?Risk ?Prescription drug management. ? ? ?41 yo F with a chief complaints of nausea vomiting and  tremors.  The patient tried to go to work and was unable and then called 911 and came here.  She describes what sounds like a panic attack though still quite tremulous now.  She does endorse a heavy amount of alcohol use and so may be in slight withdrawal.  Will attempt to control her symptoms here.  Bolus of IV fluids.  Check a lipase level. ? ?She does have mildly elevated LFTs.  This seems to be a chronic problem for her.  She has a mild anion gap which based on her history would most likely be due to ketoacidosis.  We will assess with a urine test.  She also has what appears to be a chronic leukocytosis.  Slightly worse today than it has been. ? ?The patient has a lactate greater than 4.  No obvious infection sounds more likely to be due to dehydration or perhaps alcohol withdrawal.  We will obtain a CT scan of the abdomen pelvis to evaluate for intra-abdominal pathology.  Give another bolus of IV fluids. ? ?Patient's lactate is almost cleared initial lactate of 4.2 to now 2.3.  On reassessment the patient is feeling much better.  Awaiting a CT scan of the abdomen pelvis.  Also awaiting UA.  Chest x-ray independently interpreted by me without focal infection or pneumothorax.  She still does not feel like she needs to urinate.  We will give a 3rd L of IV fluids.  Patient care was signed out to Dr. Clayborne Dana, please see his note for further details of care in the ED. ? ?I suspect the patient will likely be able to go home and continues to feel well and tolerate by mouth without signs of infection in her urine or on CT. ? ?CRITICAL CARE ?Performed by: Rae Roam ? ? ?Total  critical care time: 35 minutes ? ?Critical care time was exclusive of separately billable procedures and treating other patients. ? ?Critical care was necessary to treat or prevent imminent or life-threatening deterioration.

## 2021-07-11 NOTE — ED Provider Triage Note (Signed)
Emergency Medicine Provider Triage Evaluation Note ? ?[name] , a 41 y.o. female  was evaluated in triage.  Pt complains of generalized weakness, cramping in hands and feet, and vomiting that began today. Pt reports she drank a bottle of wine yesterday and is unsure if she is dehydrated. She drinks a bottle of wine 2-3 days of the week. Denies hx of alcohol withdrawal. Was provided 500 CC fluid bolus with EMS. ? ?Review of Systems  ?Positive: + weakness, vomiting, cramping ?Negative:  ? ?Physical Exam  ?Pulse 83   Temp 98.5 ?F (36.9 ?C) (Oral)   Resp 18   Ht 5\' 6"  (1.676 m)   Wt 74.8 kg   SpO2 100%   BMI 26.63 kg/m?  ?Gen:   Awake, no distress   ?Resp:  Normal effort  ?MSK:   Moves extremities without difficulty  ?Other:  Hands are contracted bilaterally. Actively vomiting. Tremulous.  ? ?Medical Decision Making  ?Medically screening exam initiated at 2:48 PM.  Appropriate orders placed.  [name] was informed that the remainder of the evaluation will be completed by another provider, this initial triage assessment does not replace that evaluation, and the importance of remaining in the ED until their evaluation is complete. ? ? ?  ?Eustaquio Maize, PA-C ?07/11/21 1451 ? ?

## 2021-07-12 LAB — URINALYSIS, ROUTINE W REFLEX MICROSCOPIC
Bilirubin Urine: NEGATIVE
Glucose, UA: NEGATIVE mg/dL
Hgb urine dipstick: NEGATIVE
Ketones, ur: 5 mg/dL — AB
Leukocytes,Ua: NEGATIVE
Nitrite: NEGATIVE
Protein, ur: 30 mg/dL — AB
Specific Gravity, Urine: 7.5 — ABNORMAL HIGH (ref 1.005–1.030)
pH: 7 (ref 5.0–8.0)

## 2021-07-12 LAB — HIV ANTIBODY (ROUTINE TESTING W REFLEX): HIV Screen 4th Generation wRfx: NONREACTIVE

## 2021-07-12 MED ORDER — LACTATED RINGERS IV BOLUS
1000.0000 mL | Freq: Once | INTRAVENOUS | Status: AC
Start: 2021-07-12 — End: 2021-07-12
  Administered 2021-07-12: 1000 mL via INTRAVENOUS

## 2021-07-12 MED ORDER — CHLORDIAZEPOXIDE HCL 25 MG PO CAPS: ORAL_CAPSULE | ORAL | 0 refills | Status: AC

## 2021-07-12 NOTE — ED Provider Notes (Signed)
12:34 AM ?Assumed care from Dr. Adela LankFloyd, please see their note for full history, physical and decision making until this point. In brief this is a 41 y.o. year old female who presented to the ED tonight with Weakness ?    ?History from patient and husband at bedside states that she had an episode today where she was driving and she felt her left hand cramp up and then her right hand clamped up where she could not use them and and she felt tight and muscle spasms and cramping all over so she pulled her car over and fell out of a car asking for help and they brought her here for further evaluation.  Patient was signed out to me pending CT scan and reevaluation. ? ?On my reevaluation patient appears well is little tremulous.  Is just now receiving her Librium.  Reviewing her labs she was slightly hypocalcemic and acidotic.  Her urine is at bedside and is very dark consistent with likely dehydration and.  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  She is not suicidal or homicidal but states that she drinks more alcohol and has days when and when she does not eat much related to the depression.  States that she drinks more days than not but not necessarily every day.  How much she drinks each day varies. ? ?With how dark her urine is even after liter of fluids we will give her 1 more liter.  Her lactic acid is improving.  I suspect that her bicarb is probably improved with it.  Do not see reason to recheck labs as she is feeling better and seems to be pretty clear-cut.  I did advise electrolyte drinks if she was not to be eating.  I offered resources in the community but she already has a therapist that she likes.  She will decide whether or not she wants a Librium taper to try to quit drinking. ? ?Husband at bedside with her and agrees with plan.  ? ?Urine with significantly high gravity c/w dehydration. Tolerating PO now. HR improved.  Stable for d/c with librium taper.  ? ?Discharge instructions, including strict return precautions for new or worsening symptoms, given. Patient and/or family verbalized understanding and agreement with the plan as described.  ? ?Labs, studies and imaging reviewed by myself and considered in medical decision making if ordered. Imaging interpreted by radiology. ? ?Labs Reviewed  ?CBC WITH DIFFERENTIAL/PLATELET - Abnormal; Notable for the following components:  ?    Result Value  ? WBC 1.8 (*)   ? Neutro Abs 1.1 (*)   ? Lymphs Abs 0.5 (*)   ? All other components within normal limits  ?COMPREHENSIVE METABOLIC PANEL - Abnormal; Notable for the following components:  ? CO2 18 (*)   ? Glucose, Bld 110 (*)   ? BUN <5 (*)   ? Calcium 8.5 (*)   ? Total Protein 8.3 (*)   ? AST 255 (*)   ? ALT 63 (*)   ? Total Bilirubin 1.6 (*)   ? Anion gap 19 (*)   ? All other components within normal limits  ?LACTIC ACID, PLASMA - Abnormal; Notable for the following components:  ? Lactic Acid, Venous 4.2 (*)   ? All other components within normal limits  ?LACTIC ACID, PLASMA - Abnormal; Notable for the following components:  ? Lactic Acid, Venous 2.3 (*)   ? All other components  within normal limits  ?ETHANOL  ?HIV ANTIBODY (ROUTINE TESTING W REFLEX)  ?LIPASE, BLOOD  ?URINALYSIS, ROUTINE W REFLEX MICROSCOPIC  ?I-STAT BETA HCG BLOOD, ED ([facility], [facility], [facility] ONLY)  ? ? ?DG Chest Port 1 View  ?Final Result  ?  ?CT ABDOMEN PELVIS W CONTRAST    (Results Pending)  ? ? ?No follow-ups on file. ? ?  ?Marily Memos, MD ?07/12/21 4503 ? ?

## 2021-11-21 IMAGING — US US MFM UA CORD DOPPLER
2 series · 13 of 28 positions shown · non-contrast
Comparison: none

[Series 1: us mfm ua cord doppler · 11 of 31 slices shown (1 of 2)]
[im 2/31]
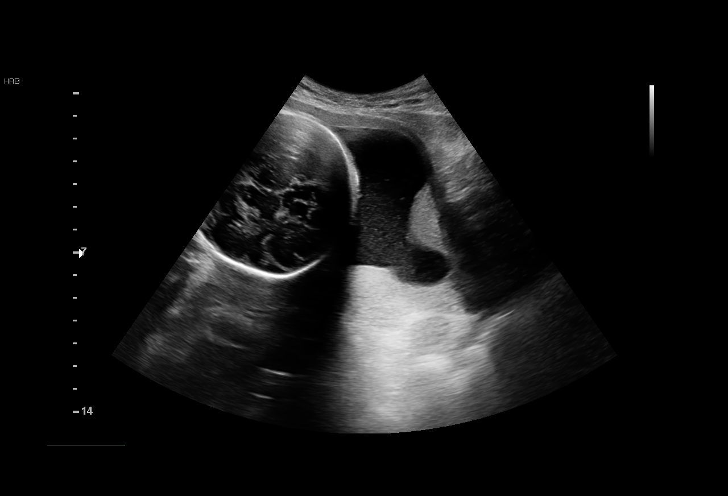
[im 5/31]
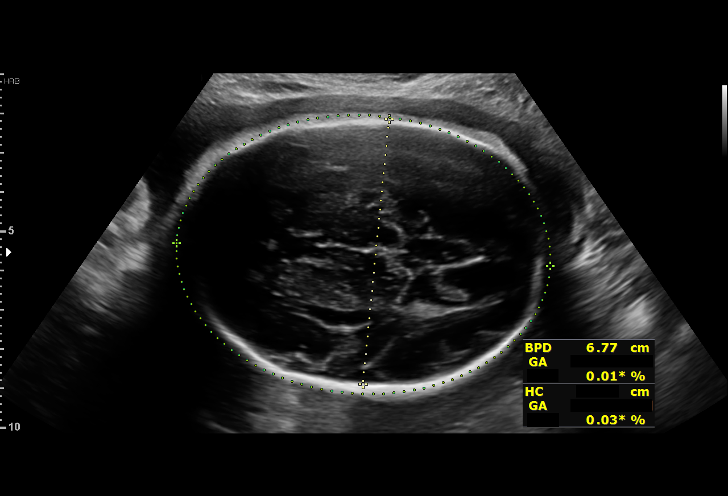
[im 7/31]
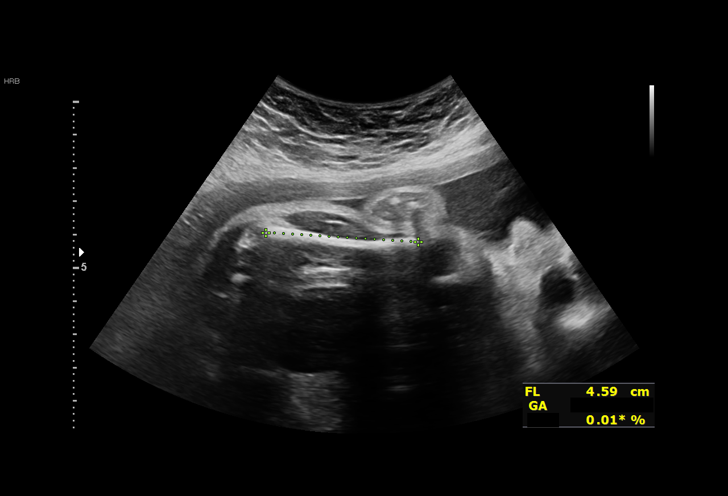
[im 10/31]
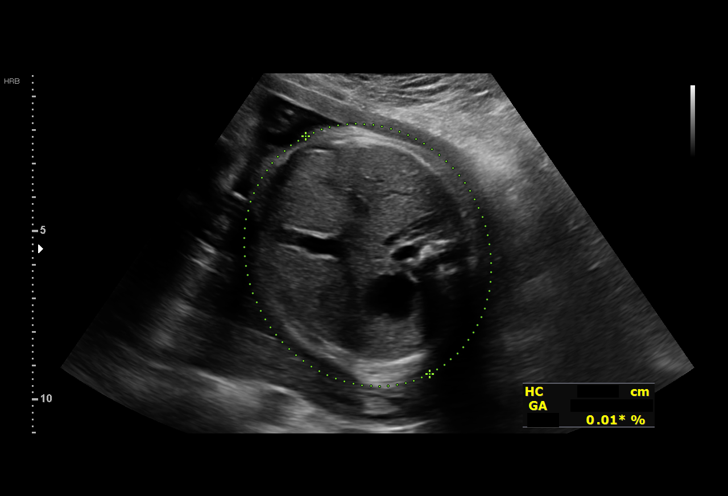
[im 13/31]
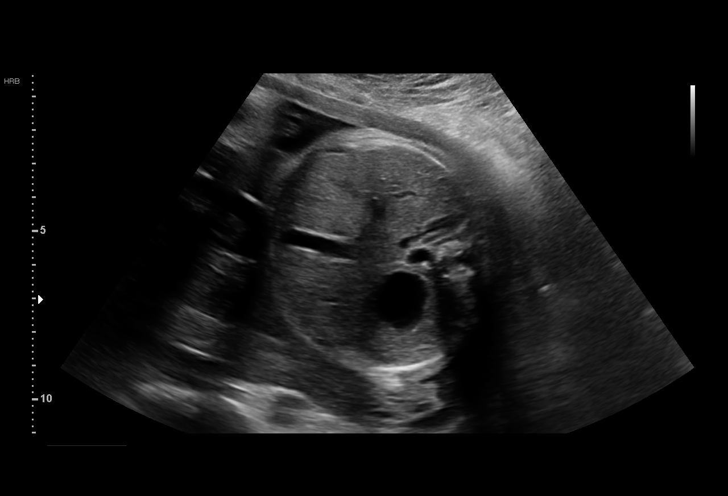
[im 15/31]
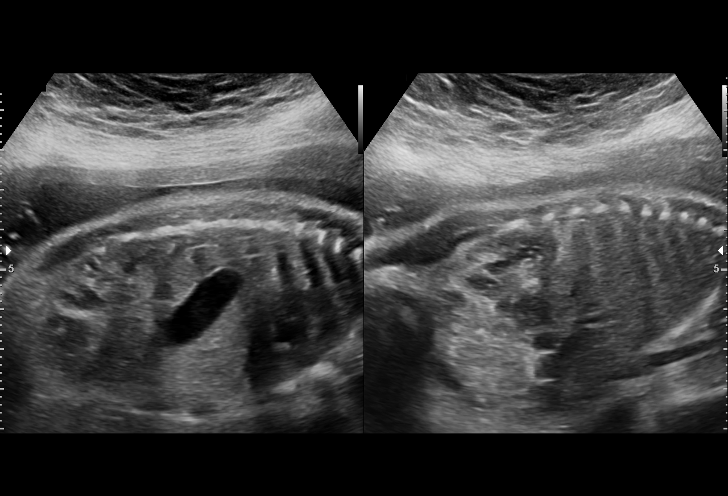
[im 20/31]
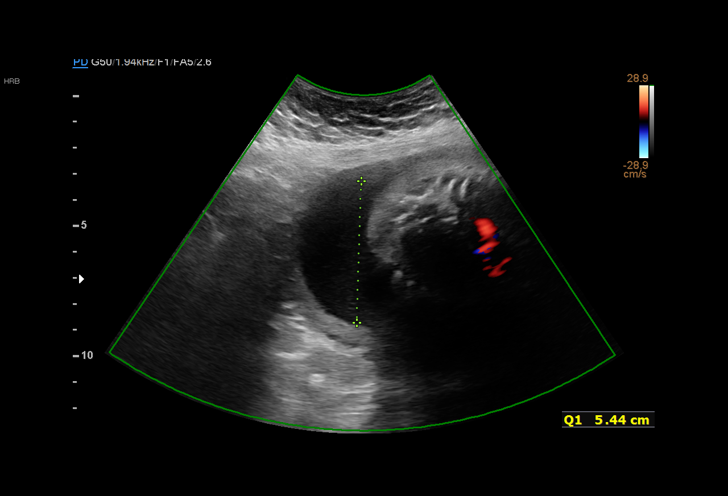
[im 22/31]
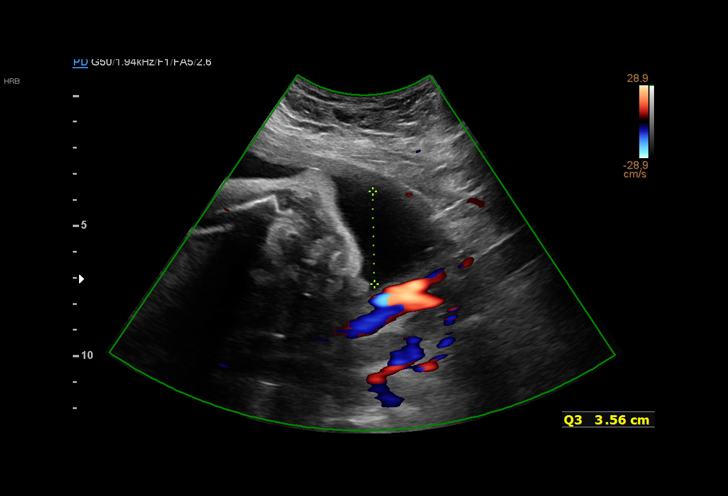
[im 25/31]
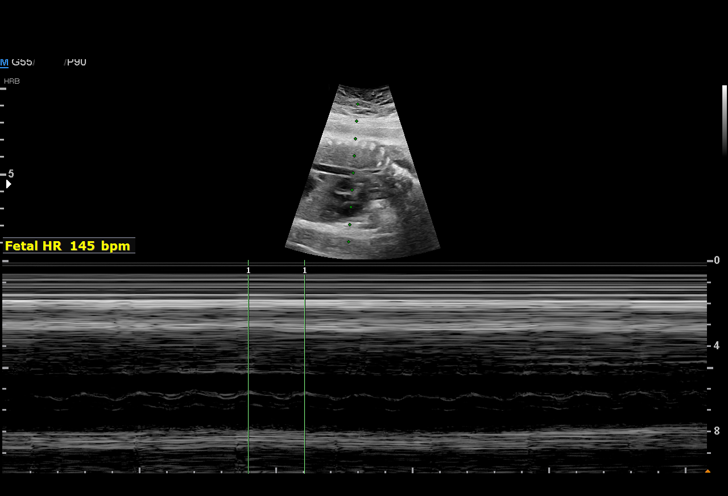
[im 28/31]
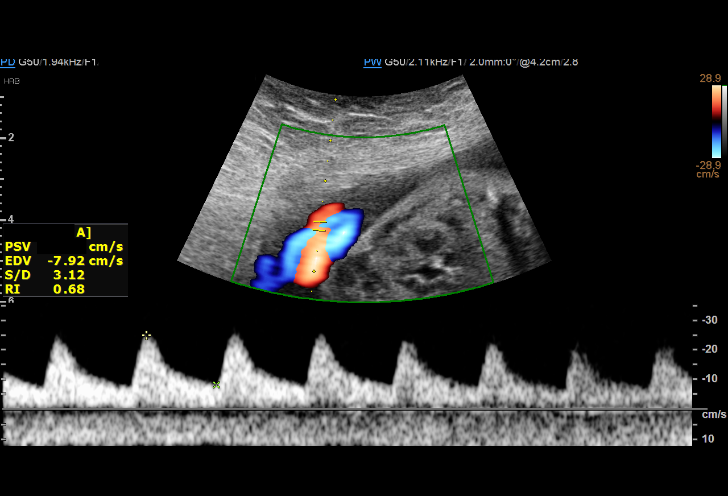
[im 31/31]
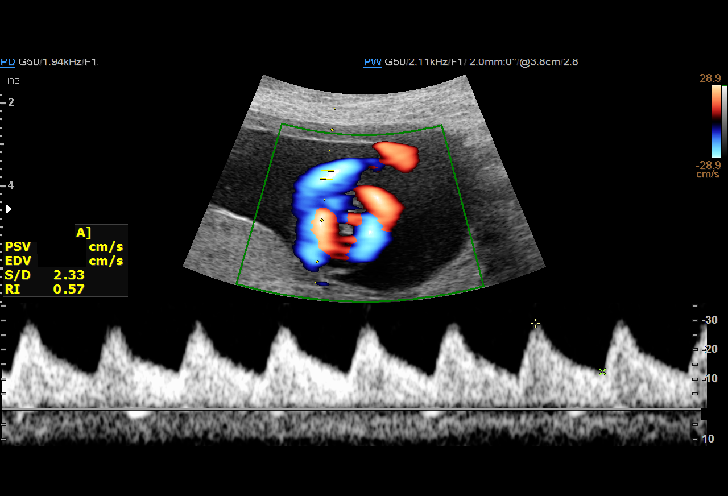

[Series 3: us mfm ua cord doppler · 7 acquisitions, 2 frames shown (2 of 2)]
[im 2/7]
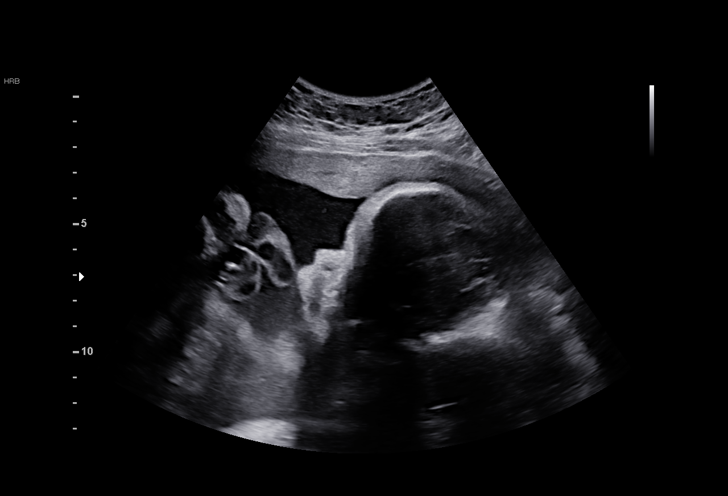
[im 5/7]
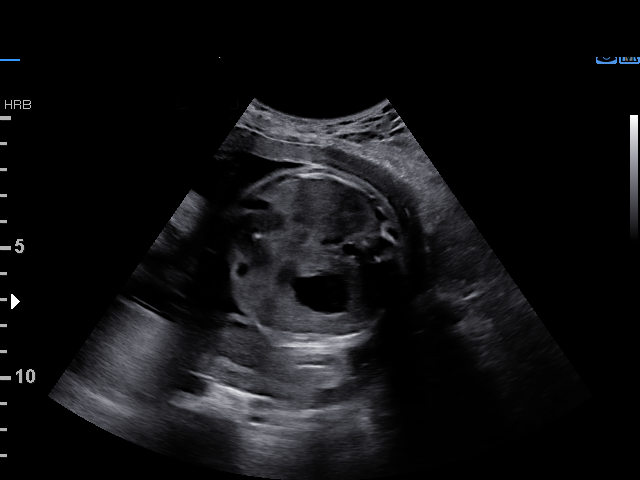

[13 of 28 positions shown; findings below may reference images not displayed]

[HOSPITAL],
                                                            Inc[facility]

Indications

 Maternal care for known or suspected poor
 fetal growth, third trimester, not applicable or
 unspecified IUGR
 Amnio results: 46XX, inv. (9) ([id number])
 Fetal abnormality - other known or
 suspected (VSD)
 Low Risk NIPS (Negative CF)
 Alcohol use complicating pregnancy, second
 trimester
 Advanced maternal age multigravida 35+,
 third trimester
 31 weeks gestation of pregnancy
Fetal Evaluation

 Num Of Fetuses:         1
 Fetal Heart Rate(bpm):  145
 Cardiac Activity:       Observed
 Presentation:           Cephalic
 Placenta:               Posterior
 P. Cord Insertion:      Previously Visualized

 Amniotic Fluid
 AFI FV:      Within normal limits

 AFI Sum(cm)     %Tile       Largest Pocket(cm)
 16.5            60
 RUQ(cm)       RLQ(cm)       LUQ(cm)        LLQ(cm)

Biometry

 BPD:      67.8  mm     G. Age:  27w 2d        < 1  %    CI:        71.56   %    70 - 86
                                                         FL/HC:      18.3   %    19.1 -
 HC:      255.2  mm     G. Age:  27w 5d        < 1  %    HC/AC:      1.10        0.96 -
 AC:      231.7  mm     G. Age:  27w 4d        < 1  %    FL/BPD:     69.0   %    71 - 87
 FL:       46.8  mm     G. Age:  25w 4d        < 1  %    FL/AC:      20.2   %    20 - 24

 Est. FW:     993  gm      2 lb 3 oz    < 1  %
OB History

 Gravidity:    6         Term:   1        Prem:   0
 TOP:          3       Ectopic:  1        Living: 1
Gestational Age

 LMP:           31w 0d        Date:  02/13/19                 EDD:   11/20/19
 U/S Today:     27w 0d                                        EDD:   12/18/19
 Best:          31w 4d     Det. By:  Previous Ultrasound      EDD:   11/16/19
                                     (03/30/19)
Anatomy

 Cranium:               Appears normal         Aortic Arch:            Previously seen
 Cavum:                 Appears normal         Ductal Arch:            Previously seen
 Ventricles:            Appears normal         Diaphragm:              Previously seen
 Choroid Plexus:        Previously seen        Stomach:                Appears normal, left
                                                                       sided
 Cerebellum:            Previously seen        Abdomen:                Appears normal
 Posterior Fossa:       Previously seen        Abdominal Wall:         Previously seen
 Nuchal Fold:           Not applicable (>20    Cord Vessels:           Previously seen
                        wks GA)
 Face:                  Orbits and profile     Kidneys:                Appear normal
                        previously seen
 Lips:                  Previously seen        Bladder:                Appears normal
 Thoracic:              Appears normal         Spine:                  Not well visualized
 Heart:                 Abnormal, see          Upper Extremities:      Previously seen
                        comments
 RVOT:                  Previously seen        Lower Extremities:      Previously seen
 LVOT:                  Abnormal, see
                        comments

 Other:  Nasal bone previously  visualized. Technically difficult due to fetal
         position.
Doppler - Fetal Vessels

 Umbilical Artery
  S/D     %tile      RI    %tile      PI    %tile            ADFV    RDFV
  2.85       58    0.65       64    1.04       75               No      No

Cervix Uterus Adnexa
 Cervix
 Normal appearance by transabdominal scan.
Comments

 This patient was seen for a follow up growth scan due to fetal
 growth restriction noted during her prior ultrasound exams.
 She denies any problems since her last exam and reports
 feeling vigorous fetal movements throughout the day.
 On today's exam, the EFW measures at less than the 1st
 percentile for her gestational age.  The fetus has grown about
 5 ounces over the past 3 weeks.  There was normal amniotic
 fluid noted.
 Doppler studies of the umbilical arteries continues to show
 normal forward flow with a normal S/D ratio of 2.85.  There
 were no signs of absent or reversed end-diastolic flow.
 The patient also had a reactive nonstress test for her
 gestational age following today's ultrasound exam.
 The patient reports that she will be transferring her obstetrical
 care for delivery at [REDACTED] on [DATE].
 Due to severe fetal growth restriction, we will continue weekly
 fetal testing and umbilical artery Doppler studies.
 Umbilical artery Doppler studies and a biophysical profile was
 scheduled in 1 week.
 We will reassess the fetal growth again in 2 weeks.  Should
 severe fetal growth restriction continue to be noted or should
 there be lack of fetal growth at her next exam, delivery may
 be considered at that time.
